# Patient Record
Sex: Female | Born: 1990 | Race: Black or African American | Hispanic: No | Marital: Single | Smoking: Current every day smoker
Health system: Southern US, Community
[De-identification: ages and names within clinical notes are randomized; demographics above are authoritative.]

## PROBLEM LIST (undated history)

## (undated) DIAGNOSIS — K259 Gastric ulcer, unspecified as acute or chronic, without hemorrhage or perforation: Secondary | ICD-10-CM

## (undated) DIAGNOSIS — A749 Chlamydial infection, unspecified: Secondary | ICD-10-CM

## (undated) DIAGNOSIS — F32A Depression, unspecified: Secondary | ICD-10-CM

## (undated) DIAGNOSIS — D649 Anemia, unspecified: Secondary | ICD-10-CM

## (undated) DIAGNOSIS — F329 Major depressive disorder, single episode, unspecified: Secondary | ICD-10-CM

---
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
## 2010-11-11 ENCOUNTER — Emergency Department: Payer: Self-pay | Admitting: Emergency Medicine

## 2011-05-03 ENCOUNTER — Emergency Department: Payer: Self-pay | Admitting: Emergency Medicine

## 2011-07-12 ENCOUNTER — Emergency Department: Payer: Self-pay | Admitting: Internal Medicine

## 2011-10-26 ENCOUNTER — Emergency Department: Payer: Self-pay | Admitting: Emergency Medicine

## 2012-02-04 ENCOUNTER — Emergency Department: Payer: Self-pay | Admitting: Internal Medicine

## 2012-05-04 ENCOUNTER — Emergency Department: Payer: Self-pay | Admitting: *Deleted

## 2012-05-04 LAB — URINALYSIS, COMPLETE
Bilirubin,UR: NEGATIVE
Blood: NEGATIVE
Glucose,UR: NEGATIVE mg/dL (ref 0–75)
Ketone: NEGATIVE
Nitrite: NEGATIVE
Ph: 5 (ref 4.5–8.0)
Protein: NEGATIVE
RBC,UR: 4 /HPF (ref 0–5)
Specific Gravity: 1.029 (ref 1.003–1.030)
Squamous Epithelial: 11
WBC UR: 5 /HPF (ref 0–5)

## 2012-05-04 LAB — WET PREP, GENITAL

## 2012-05-04 LAB — PREGNANCY, URINE: Pregnancy Test, Urine: NEGATIVE m[IU]/mL

## 2012-09-26 ENCOUNTER — Emergency Department: Payer: Self-pay | Admitting: Emergency Medicine

## 2012-09-26 ENCOUNTER — Emergency Department: Payer: Self-pay | Admitting: Unknown Physician Specialty

## 2012-10-03 ENCOUNTER — Emergency Department: Payer: Self-pay | Admitting: Emergency Medicine

## 2012-11-02 ENCOUNTER — Ambulatory Visit: Payer: Self-pay

## 2013-03-07 ENCOUNTER — Emergency Department: Payer: Self-pay | Admitting: Emergency Medicine

## 2013-06-03 ENCOUNTER — Emergency Department: Payer: Self-pay | Admitting: Internal Medicine

## 2013-06-03 LAB — CBC
HCT: 32.9 % — ABNORMAL LOW (ref 35.0–47.0)
HGB: 10.8 g/dL — ABNORMAL LOW (ref 12.0–16.0)
MCV: 86 fL (ref 80–100)
Platelet: 254 10*3/uL (ref 150–440)

## 2013-06-03 LAB — COMPREHENSIVE METABOLIC PANEL
Albumin: 4 g/dL (ref 3.4–5.0)
Calcium, Total: 8.9 mg/dL (ref 8.5–10.1)
Chloride: 109 mmol/L — ABNORMAL HIGH (ref 98–107)
Co2: 27 mmol/L (ref 21–32)
EGFR (African American): >60
EGFR (Non-African Amer.): >60
Potassium: 3.9 mmol/L (ref 3.5–5.1)
SGPT (ALT): 15 U/L (ref 12–78)

## 2013-06-03 LAB — URINALYSIS, COMPLETE
Blood: NEGATIVE
Glucose,UR: NEGATIVE mg/dL (ref 0–75)
Ketone: NEGATIVE
Nitrite: NEGATIVE
Ph: 6 (ref 4.5–8.0)
Protein: NEGATIVE
RBC,UR: 2 /HPF (ref 0–5)
Specific Gravity: 1.023 (ref 1.003–1.030)
Squamous Epithelial: 6
WBC UR: 2 /HPF (ref 0–5)

## 2013-06-03 LAB — WET PREP, GENITAL

## 2013-06-03 LAB — GC/CHLAMYDIA PROBE AMP

## 2013-10-11 ENCOUNTER — Emergency Department: Payer: Self-pay | Admitting: Internal Medicine

## 2013-10-11 LAB — COMPREHENSIVE METABOLIC PANEL
Albumin: 4.2 g/dL (ref 3.4–5.0)
Alkaline Phosphatase: 62 U/L (ref 50–136)
BUN: 9 mg/dL (ref 7–18)
Chloride: 106 mmol/L (ref 98–107)
Co2: 25 mmol/L (ref 21–32)
EGFR (African American): >60
Glucose: 94 mg/dL (ref 65–99)
Potassium: 3.1 mmol/L — ABNORMAL LOW (ref 3.5–5.1)
SGOT(AST): 22 U/L (ref 15–37)
SGPT (ALT): 14 U/L (ref 12–78)
Sodium: 137 mmol/L (ref 136–145)
Total Protein: 7.6 g/dL (ref 6.4–8.2)

## 2013-10-11 LAB — URINALYSIS, COMPLETE
Bacteria: NONE SEEN
Bilirubin,UR: NEGATIVE
Blood: NEGATIVE
Leukocyte Esterase: NEGATIVE
Nitrite: NEGATIVE
Ph: 6 (ref 4.5–8.0)
Protein: NEGATIVE
RBC,UR: 2 /HPF (ref 0–5)
Specific Gravity: 1.026 (ref 1.003–1.030)
WBC UR: 2 /HPF (ref 0–5)

## 2013-10-11 LAB — LIPASE, BLOOD: Lipase: 102 U/L (ref 73–393)

## 2013-10-11 LAB — CBC
HCT: 31.4 % — ABNORMAL LOW (ref 35.0–47.0)
MCH: 25.8 pg — ABNORMAL LOW (ref 26.0–34.0)
MCHC: 32.1 g/dL (ref 32.0–36.0)
RBC: 3.9 10*6/uL (ref 3.80–5.20)
WBC: 6.5 10*3/uL (ref 3.6–11.0)

## 2013-10-20 ENCOUNTER — Emergency Department: Payer: Self-pay | Admitting: Emergency Medicine

## 2013-10-20 LAB — URINALYSIS, COMPLETE
Bilirubin,UR: NEGATIVE
Blood: NEGATIVE
Nitrite: NEGATIVE
Ph: 8 (ref 4.5–8.0)
RBC,UR: <1 /HPF (ref 0–5)
Specific Gravity: 1.019 (ref 1.003–1.030)
Squamous Epithelial: 13
WBC UR: 10 /HPF (ref 0–5)

## 2014-05-19 ENCOUNTER — Emergency Department: Payer: Self-pay | Admitting: Emergency Medicine

## 2014-05-19 LAB — LIPASE, BLOOD: LIPASE: 148 U/L (ref 73–393)

## 2014-05-19 LAB — DRUG SCREEN, URINE

## 2014-05-19 LAB — COMPREHENSIVE METABOLIC PANEL
Albumin: 4 g/dL (ref 3.4–5.0)
Alkaline Phosphatase: 43 U/L — ABNORMAL LOW
Anion Gap: 8 (ref 7–16)
BUN: 5 mg/dL — ABNORMAL LOW (ref 7–18)
Bilirubin,Total: 0.8 mg/dL (ref 0.2–1.0)
Calcium, Total: 9.2 mg/dL (ref 8.5–10.1)
Chloride: 105 mmol/L (ref 98–107)
Co2: 23 mmol/L (ref 21–32)
Creatinine: 0.55 mg/dL — ABNORMAL LOW (ref 0.60–1.30)
EGFR (African American): >60
EGFR (Non-African Amer.): >60
Glucose: 78 mg/dL (ref 65–99)
Osmolality: 268 (ref 275–301)
Potassium: 3.5 mmol/L (ref 3.5–5.1)
SGOT(AST): 18 U/L (ref 15–37)
SGPT (ALT): 15 U/L (ref 12–78)
Sodium: 136 mmol/L (ref 136–145)
Total Protein: 7.7 g/dL (ref 6.4–8.2)

## 2014-05-19 LAB — URINALYSIS, COMPLETE
BLOOD: NEGATIVE
Bilirubin,UR: NEGATIVE
Glucose,UR: NEGATIVE mg/dL (ref 0–75)
Ketone: NEGATIVE
Leukocyte Esterase: NEGATIVE
NITRITE: NEGATIVE
Ph: 6 (ref 4.5–8.0)
Protein: NEGATIVE
RBC,UR: <1 /HPF (ref 0–5)
Specific Gravity: 1.025 (ref 1.003–1.030)
Squamous Epithelial: 22
WBC UR: 2 /HPF (ref 0–5)

## 2014-05-19 LAB — TSH: Thyroid Stimulating Horm: 1.01 u[IU]/mL

## 2014-05-19 LAB — CBC
HCT: 35.8 % (ref 35.0–47.0)
HGB: 11.6 g/dL — ABNORMAL LOW (ref 12.0–16.0)
MCH: 27.6 pg (ref 26.0–34.0)
MCHC: 32.3 g/dL (ref 32.0–36.0)
MCV: 85 fL (ref 80–100)
Platelet: 261 10*3/uL (ref 150–440)
RBC: 4.2 10*6/uL (ref 3.80–5.20)
RDW: 22.8 % — ABNORMAL HIGH (ref 11.5–14.5)
WBC: 7.8 10*3/uL (ref 3.6–11.0)

## 2014-05-19 LAB — WET PREP, GENITAL

## 2014-05-19 LAB — ETHANOL: Ethanol %: <0.003 % (ref 0.000–0.080)

## 2014-05-19 LAB — HCG, QUANTITATIVE, PREGNANCY: Beta Hcg, Quant.: 96274 m[IU]/mL — ABNORMAL HIGH

## 2014-05-19 LAB — SALICYLATE LEVEL: Salicylates, Serum: <1.7 mg/dL

## 2014-05-19 LAB — ACETAMINOPHEN LEVEL: Acetaminophen: <2 ug/mL

## 2014-05-19 LAB — GC/CHLAMYDIA PROBE AMP

## 2014-07-28 ENCOUNTER — Emergency Department: Payer: Self-pay | Admitting: Emergency Medicine

## 2014-07-28 LAB — COMPREHENSIVE METABOLIC PANEL
ALK PHOS: 43 U/L — AB
ALT: 16 U/L
Albumin: 3.4 g/dL (ref 3.4–5.0)
Anion Gap: 12 (ref 7–16)
BUN: 5 mg/dL — ABNORMAL LOW (ref 7–18)
Bilirubin,Total: 0.9 mg/dL (ref 0.2–1.0)
CALCIUM: 8.8 mg/dL (ref 8.5–10.1)
Chloride: 107 mmol/L (ref 98–107)
Co2: 20 mmol/L — ABNORMAL LOW (ref 21–32)
Creatinine: 0.63 mg/dL (ref 0.60–1.30)
EGFR (African American): >60
EGFR (Non-African Amer.): >60
Glucose: 89 mg/dL (ref 65–99)
Osmolality: 274 (ref 275–301)
Potassium: 3.6 mmol/L (ref 3.5–5.1)
SGOT(AST): 22 U/L (ref 15–37)
SODIUM: 139 mmol/L (ref 136–145)
Total Protein: 7.4 g/dL (ref 6.4–8.2)

## 2014-07-28 LAB — URINALYSIS, COMPLETE
BILIRUBIN, UR: NEGATIVE
Blood: NEGATIVE
GLUCOSE, UR: NEGATIVE mg/dL (ref 0–75)
Hyaline Cast: 5
Leukocyte Esterase: NEGATIVE
Nitrite: NEGATIVE
PH: 5 (ref 4.5–8.0)
RBC,UR: 2 /HPF (ref 0–5)
Specific Gravity: 1.025 (ref 1.003–1.030)
Squamous Epithelial: 23
WBC UR: 5 /HPF (ref 0–5)

## 2014-07-28 LAB — CBC
HCT: 36.5 % (ref 35.0–47.0)
HGB: 11.8 g/dL — ABNORMAL LOW (ref 12.0–16.0)
MCH: 31.8 pg (ref 26.0–34.0)
MCHC: 32.3 g/dL (ref 32.0–36.0)
MCV: 99 fL (ref 80–100)
Platelet: 270 10*3/uL (ref 150–440)
RBC: 3.7 10*6/uL — ABNORMAL LOW (ref 3.80–5.20)
RDW: 13 % (ref 11.5–14.5)
WBC: 11.7 10*3/uL — AB (ref 3.6–11.0)

## 2014-07-28 LAB — ETHANOL: Ethanol %: <0.003 % (ref 0.000–0.080)

## 2014-07-28 LAB — DRUG SCREEN, URINE

## 2014-07-28 LAB — ACETAMINOPHEN LEVEL: Acetaminophen: <2 ug/mL

## 2014-07-28 LAB — SALICYLATE LEVEL

## 2014-08-01 ENCOUNTER — Ambulatory Visit: Payer: Self-pay | Admitting: Family Medicine

## 2014-09-16 ENCOUNTER — Observation Stay: Payer: Self-pay

## 2014-11-02 ENCOUNTER — Ambulatory Visit: Payer: Self-pay | Admitting: Family Medicine

## 2014-12-20 ENCOUNTER — Ambulatory Visit: Payer: Self-pay | Admitting: Obstetrics and Gynecology

## 2014-12-20 LAB — CBC WITH DIFFERENTIAL/PLATELET
Basophil #: 0 10*3/uL (ref 0.0–0.1)
Basophil %: 0.2 %
EOS PCT: 0.2 %
Eosinophil #: 0 10*3/uL (ref 0.0–0.7)
HCT: 31.8 % — ABNORMAL LOW (ref 35.0–47.0)
HGB: 10.2 g/dL — AB (ref 12.0–16.0)
Lymphocyte #: 1.5 10*3/uL (ref 1.0–3.6)
Lymphocyte %: 18.2 %
MCH: 29.9 pg (ref 26.0–34.0)
MCHC: 32 g/dL (ref 32.0–36.0)
MCV: 94 fL (ref 80–100)
MONO ABS: 0.4 x10 3/mm (ref 0.2–0.9)
Monocyte %: 5.3 %
NEUTROS PCT: 76.1 %
Neutrophil #: 6.1 10*3/uL (ref 1.4–6.5)
Platelet: 213 10*3/uL (ref 150–440)
RBC: 3.4 10*6/uL — AB (ref 3.80–5.20)
RDW: 14 % (ref 11.5–14.5)
WBC: 8 10*3/uL (ref 3.6–11.0)

## 2014-12-21 ENCOUNTER — Inpatient Hospital Stay: Payer: Self-pay | Admitting: Obstetrics and Gynecology

## 2014-12-21 LAB — GC/CHLAMYDIA PROBE AMP

## 2014-12-22 LAB — HEMATOCRIT: HCT: 28.5 % — AB (ref 35.0–47.0)

## 2014-12-24 ENCOUNTER — Emergency Department: Payer: Self-pay | Admitting: Emergency Medicine

## 2015-02-05 ENCOUNTER — Emergency Department: Payer: Self-pay | Admitting: Student

## 2015-04-06 NOTE — Consult Note (Signed)
PATIENT NAMPenne Lash:  Johnson, Carrie L MR#:  [id number] DATE OF BIRTH:  1991-11-09  DATE OF CONSULTATION:  07/29/2014  CONSULTING PHYSICIAN:  Sanjana Folz K. Haeli Gerlich, MD  SEX: Female.  RACE: African American.  SUBJECTIVE: The patient was seen in consultation in [facility]. The patient is a 24 year old [place] female who was employed and held a job since last year. The patient stated in an impulse to her mother that she was going to drink Clorox and the message was carried from the mother to the ER physician. The patient stayed here overnight and calmed herself off. She talked to her mother on the phone who was reassuring and was helpful. The patient reports that she has a 24-year-old son and she is the only person who can drive him to school as he is in elementary school. Feels that she stated something stupid and that was not correct.  OBJECTIVE: Alert and oriented to place, person, time. Calm, pleasant, and cooperative. No agitation. Affect is neutral. Mood is stable. Denies feeling depressed. Realizes that she was upset because was having marital conflicts with her husband and she stated things that she did not mean. No psychosis. Cognition intact. Denies any ideas or plans to hurt herself or others. Insight and judgement fair and adequate. Contracts for safety.  IMPRESSION: Impulse control disorder.  PLAN: Discontinue involuntary commitment. Discharge patient home to go back to the family and keep her followup appointments with her primary care physician, OB/GYN, and get help from counselor so that she can have better understanding about herself and better relationship with her family.   ____________________________ Jannet MantisSurya K. Guss Bundehalla, MD skc:lt D: 07/29/2014 16:44:44 ET T: 07/29/2014 18:57:26 ET JOB#: [id number]  cc: Monika SalkSurya K. Guss Bundehalla, MD, <Dictator> Beau FannySURYA K Brantley Naser MD ELECTRONICALLY SIGNED 08/01/2014 23:00

## 2015-04-06 NOTE — Consult Note (Signed)
PATIENT NAMYolanda Johnson:  Carrie Johnson, Carrie Johnson MR#:  [id number] DATE OF BIRTH:  [birth year]  DATE OF CONSULTATION:  05/20/2014  REFERRING PHYSICIAN:   CONSULTING PHYSICIAN:  Laterria Lasota K. Guss Bundehalla, MD  SUBJECTIVE:  The patient is a 24 year old [place] female, not employed and married since February of 2014, lives with her husband who is 24 years old and works with paint.  The patient had marital conflict with her husband because she felt that he is ignoring her and she is not getting enough attention and so she tried to make superficial scratches on her left forearm with a razor that she shaves her legs.  The patient was smiling while talking, saying the same thing.  She was talking on the phone with her husband and appears to be smiling while talking to her husband.    CHIEF COMPLAINT:  "I just wanted my husband to give attention to me and to the baby as I am [redacted] weeks pregnant."  PAST PSYCHIATRIC HISTORY:  No previous history of depression or anxiety.  No history of suicide.  Not being followed by a psychiatrist.  [facility] her [facility] and last appointment was a few weeks ago, next appointment is coming up on 05/23/2014 and she is eager to keep the followup appointment.    ALCOHOL AND DRUGS:  Denied.  MENTAL STATUS EXAMINATION:  The patient is alert and oriented to place, person and time, fully aware of the situation that brought her here.  Affect is bright and cheerful.  She is having good conversation with the husband on the phone and she had to hang up to talk to the undersigned.  Smiling and cheerful and is eager to go home to take care of her husband and her baby and keep up her followup appointments.  No psychosis.  Cognition intact.  General information fair.  Denies suicidal or homicidal ideas or plans and contracts for safety.  Insight and judgment fair.    IMPRESSION:  Impulse control disorder with superficial abrasions on her left forearm which did not need any attention  or help or treatment.    RECOMMENDATIONS:  Discharge the patient so that she can go back to her husband and keep up her followup appointments in the community.  She will get help for her marital conflicts so that they can have better understanding of each other.    ____________________________ Jannet MantisSurya K. Guss Bundehalla, MD skc:cs D: 05/20/2014 17:32:41 ET T: 05/20/2014 20:20:54 ET JOB#: [id number]  cc: Monika SalkSurya K. Guss Bundehalla, MD, <Dictator> Beau FannySURYA K Fredrich Cory MD ELECTRONICALLY SIGNED 05/21/2014 7:02

## 2015-04-06 NOTE — Consult Note (Signed)
PATIENT NAMPenne Johnson:  Clapham, SHAKIRA L MR#:  [id number] DATE OF BIRTH:  [birth year]  DATE OF CONSULTATION:  07/28/2014  CONSULTING PHYSICIAN:  Kalli Greenfield K. Guss Bundehalla, MD  PLACE OF DICTATION:  [facility], [address], [place].  AGE: 24.  SEX:  Female.  RACE:  African American  SUBJECTIVE:  The patient was seen in consultation in the [facility].  The patient is a 24 year old African American female who is employed in the [employer] and held a job for 1 year.  The patient reports that she is married and having lots of marital conflicts according to information obtained from the staff at Emergency Room.  The physician called mother who stated that the patient threatened to drink Clorox.  When the patient was confronted, she did not agree to the same.  In addition, information obtained from the staff that she been text messaging to her husband about her wishes of suicide which the patient denies.  PAST PSYCHIATRIC HISTORY:  No previous history of inpatient hospital to psychiatry. No history of suicide attempt.  ALCOHOL AND DRUGS:  None.  MENTAL STATUS:  The patient is alert and oriented.  The patient was calm, but gets agitated when questions were asked, very manipulative and attention seeking and does not appear to respond to internal stimuli.  Cognition is intact.  Behavior is questionable and unpredictable, and she does need close observation, evaluation and help for the same.  Insight and judgment guarded.  Impulse control is poor.  IMPRESSION:  Impulse control disorder with depression.  RECOMMENDATIONS:  Recommend inpatient hospital psychiatry for close observation and help. No medications are being given as the patient is pregnant.    ____________________________ Jannet MantisSurya K. Guss Bundehalla, MD skc:DT D: 07/28/2014 19:34:13 ET T: 07/28/2014 20:02:38 ET JOB#: [id number]  cc: Monika SalkSurya K. Guss Bundehalla, MD, <Dictator> Beau FannySURYA K Princella Jaskiewicz MD ELECTRONICALLY SIGNED 07/29/2014 16:18

## 2015-04-08 LAB — SURGICAL PATHOLOGY

## 2015-04-14 NOTE — H&P (Signed)
PATIENT NAMEARRON, Carrie Johnson MR#:  [id number] DATE OF BIRTH:  27-Jul-1991  DATE OF ADMISSION:  12/21/2014  PREOPERATIVE DIAGNOSES:  1.  Prior cesarean section.  2.  Group B streptococcus negative.   POSTOPERATIVE DIAGNOSES:   1.  Prior cesarean section.  2.  Group B streptococcus negative.  3.  Delivery of viable female infant.   PROCEDURE:  Repeat low transverse cesarean section with placement of On-Q pump.  ANESTHESIA: Use spinal.   SURGEON: Christeen Douglas, MD   ESTIMATED BLOOD LOSS: 400 mL.     OPERATIVE FLUIDS: 1400 mL.   URINE OUTPUT: 150 mL.   COMPLICATIONS: None.   FINDINGS: A viable and vigorous female infant weighing 2790 grams or 6 pounds 2 ounces with Apgars of 8 and 9 at one and five minutes respectively. Normal uterus, ovaries, and bilateral tubes. A solid-appearing omental nodule that was removed and sent for specimen.   INDICATION FOR PROCEDURE: Ms. Carrie Johnson is a 24 year old gravida 2, para 1, 0-0-1 at 39 weeks and 5 days who presents for an elective repeat cesarean section.  Risks and benefits of this procedure were discussed with the patient and she consented to the procedure.  PROCEDURE:  The patient was taken to the operating room where she was placed on the operating table in the upright position and spinal anesthesia was induced without difficulty. She was then positioned in a supine position and prepped and draped in the usual sterile fashion. Ancef IV was given for prophylactic antibiotics prior to the start of the procedure.  A formal timeout was performed with all team members present and in agreement.   Pfannenstiel skin incision was made sharply and carried down to the fascia sharply. The fascia was then incised at the midline sharply and the fascial incision carried laterally using Mayo scissors.  Kocher clamps were used to grasp the superior edge of the rectus fascia and it was separated off the underlying rectus muscle using Mayo scissors. In a similar  fashion, the inferior edge of the rectus fascia was separated from the underlying rectus muscles.  Muscles were then divided bluntly in the midline and the peritoneum was entered sharply being careful to avoid any adherent tissue.  The peritoneal incision was stretched laterally bluntly and the bladder blade was placed.  A bladder flap was created and the bladder blade replaced.   Hysterotomy was made and carried carefully down to the uterine cavity. The membranes were ruptured in a controlled fashion for clear fluid.  The hysterotomy was spread laterally bluntly. The fetal vertex was then brought to the hysterotomy and using fundal pressure the baby was atraumatically delivered.  He was immediately vigorous and spontaneously crying.  He was bulb suctioned on the operative field. The cord was doubly clamped and cut and the baby was passed off to awaiting nursery staff.   A 3-vessel cord was noted and the placenta was manually expressed. The uterus was exteriorized and cleared of all clots and debris. The hysterotomy was closed in a running locked fashion using 0-Vicryl suture and a second imbricating layer was placed.  Several figure-of-eight sutures were used to ensure hemostasis. The peritoneal cavity was cleared of clots and debris and the uterus was placed back into the peritoneal cavity.  The peritoneal gutters were then cleared of blood and attention was turned returned to the hysterotomy.  Bovie cautery was used to assure hemostasis off of tension.   The On-Q catheters were then placed infraumbilically bilaterally.  The catheters  were curled under the fascial shelf and the fascia was closed in a running fashion using 0 Vicryl suture. The subcutaneous tissue was irrigated and then the skin was closed using absorbable staples.  A pressure dressing was applied and the On-Q pump catheters were secured to the maternal abdomen using Steri-Strips and Tegaderm, as well as Dermabond at the site of incision. The  patient tolerated all of these procedures well and is stable in recovery.      ____________________________ Cline CoolsBethany E. Marlon Suleiman, MD beb:DT D: 12/21/2014 09:21:10 ET T: 12/21/2014 09:45:45 ET JOB#: [id number]  cc: Cline CoolsBethany E. Ziyan Schoon, MD, <Dictator> Cline CoolsBETHANY E Kathe Wirick MD ELECTRONICALLY SIGNED 12/25/2014 9:28

## 2015-04-14 NOTE — Op Note (Signed)
PATIENT NAME:  Carrie Johnson, Carrie Johnson MR#:  [id number] DATE OF BIRTH:  January 16, 1991  DATE OF PROCEDURE:  12/21/2014  PREOPERATIVE DIAGNOSES:  1.  Prior cesarean section.  2.  Group B streptococcus negative.   POSTOPERATIVE DIAGNOSES:   1.  Prior cesarean section.  2.  Group B streptococcus negative.  3.  Delivery of viable female infant.   PROCEDURE:  Repeat low transverse cesarean section with placement of On-Q pump.  ANESTHESIA: Use spinal.   SURGEON: Christeen Douglas, MD   ESTIMATED BLOOD LOSS: 400 mL.     OPERATIVE FLUIDS: 1400 mL.   URINE OUTPUT: 150 mL.   COMPLICATIONS: None.   FINDINGS: A viable and vigorous female infant weighing 2790 grams or 6 pounds 2 ounces with Apgars of 8 and 9 at one and five minutes respectively. Normal uterus, ovaries, and bilateral tubes. A solid-appearing omental nodule that was removed and sent for specimen.   INDICATION FOR PROCEDURE: Ms. Carrie Johnson is a 24 year old gravida 2, para 1, 0-0-1 at 39 weeks and 5 days who presents for an elective repeat cesarean section.  Risks and benefits of this procedure were discussed with the patient and she consented to the procedure.  PROCEDURE:  The patient was taken to the operating room where she was placed on the operating table in the upright position and spinal anesthesia was induced without difficulty. She was then positioned in a supine position and prepped and draped in the usual sterile fashion. Ancef IV was given for prophylactic antibiotics prior to the start of the procedure.  A formal timeout was performed with all team members present and in agreement.   Pfannenstiel skin incision was made sharply and carried down to the fascia sharply. The fascia was then incised at the midline sharply and the fascial incision carried laterally using Mayo scissors.  Kocher clamps were used to grasp the superior edge of the rectus fascia and it was separated off the underlying rectus muscle using Mayo scissors. In a similar  fashion, the inferior edge of the rectus fascia was separated from the underlying rectus muscles.  Muscles were then divided bluntly in the midline and the peritoneum was entered sharply being careful to avoid any adherent tissue.  The peritoneal incision was stretched laterally bluntly and the bladder blade was placed.  A bladder flap was created and the bladder blade replaced.   Hysterotomy was made and carried carefully down to the uterine cavity. The membranes were ruptured in a controlled fashion for clear fluid.  The hysterotomy was spread laterally bluntly. The fetal vertex was then brought to the hysterotomy and using fundal pressure the baby was atraumatically delivered.  He was immediately vigorous and spontaneously crying.  He was bulb suctioned on the operative field. The cord was doubly clamped and cut and the baby was passed off to awaiting nursery staff.   A 3-vessel cord was noted and the placenta was manually expressed. The uterus was exteriorized and cleared of all clots and debris. The hysterotomy was closed in a running locked fashion using 0-Vicryl suture and a second imbricating layer was placed.  Several figure-of-eight sutures were used to ensure hemostasis. The peritoneal cavity was cleared of clots and debris and the uterus was placed back into the peritoneal cavity.  The peritoneal gutters were then cleared of blood and attention was turned returned to the hysterotomy.  Bovie cautery was used to assure hemostasis off of tension.   The On-Q catheters were then placed infraumbilically bilaterally.  The catheters  were curled under the fascial shelf and the fascia was closed in a running fashion using 0 Vicryl suture. The subcutaneous tissue was irrigated and then the skin was closed using absorbable staples.  A pressure dressing was applied and the On-Q pump catheters were secured to the maternal abdomen using Steri-Strips and Tegaderm, as well as Dermabond at the site of incision. The  patient tolerated all of these procedures well and is stable in recovery.      ____________________________ Cline CoolsBethany E. Fidela Cieslak, MD beb:DT D: 12/21/2014 09:21:10 ET T: 12/21/2014 09:45:45 ET JOB#: [id number]  cc: Cline CoolsBethany E. Ryn Peine, MD, <Dictator> Cline CoolsBETHANY E Jearline Hirschhorn MD ELECTRONICALLY SIGNED 12/25/2014 9:28

## 2015-09-05 ENCOUNTER — Emergency Department
Admission: EM | Admit: 2015-09-05 | Discharge: 2015-09-05 | Disposition: A | Discharge status: Home / Self Care | Payer: Medicaid Other | Attending: Emergency Medicine | Admitting: Emergency Medicine

## 2015-09-05 ENCOUNTER — Encounter: Payer: Self-pay | Admitting: *Deleted

## 2015-09-05 ENCOUNTER — Emergency Department: Payer: Medicaid Other

## 2015-09-05 DIAGNOSIS — O2341 Unspecified infection of urinary tract in pregnancy, first trimester: Secondary | ICD-10-CM | POA: Diagnosis not present

## 2015-09-05 DIAGNOSIS — N760 Acute vaginitis: Secondary | ICD-10-CM

## 2015-09-05 DIAGNOSIS — O9989 Other specified diseases and conditions complicating pregnancy, childbirth and the puerperium: Secondary | ICD-10-CM | POA: Diagnosis present

## 2015-09-05 DIAGNOSIS — B9689 Other specified bacterial agents as the cause of diseases classified elsewhere: Secondary | ICD-10-CM

## 2015-09-05 DIAGNOSIS — O99331 Smoking (tobacco) complicating pregnancy, first trimester: Secondary | ICD-10-CM | POA: Diagnosis not present

## 2015-09-05 DIAGNOSIS — O98811 Other maternal infectious and parasitic diseases complicating pregnancy, first trimester: Secondary | ICD-10-CM | POA: Insufficient documentation

## 2015-09-05 DIAGNOSIS — B373 Candidiasis of vulva and vagina: Secondary | ICD-10-CM | POA: Diagnosis not present

## 2015-09-05 DIAGNOSIS — O23591 Infection of other part of genital tract in pregnancy, first trimester: Secondary | ICD-10-CM | POA: Diagnosis not present

## 2015-09-05 DIAGNOSIS — Z3A01 Less than 8 weeks gestation of pregnancy: Secondary | ICD-10-CM | POA: Diagnosis not present

## 2015-09-05 DIAGNOSIS — F17210 Nicotine dependence, cigarettes, uncomplicated: Secondary | ICD-10-CM | POA: Insufficient documentation

## 2015-09-05 DIAGNOSIS — B3731 Acute candidiasis of vulva and vagina: Secondary | ICD-10-CM

## 2015-09-05 DIAGNOSIS — N390 Urinary tract infection, site not specified: Secondary | ICD-10-CM

## 2015-09-05 DIAGNOSIS — R102 Pelvic and perineal pain: Secondary | ICD-10-CM

## 2015-09-05 LAB — URINALYSIS COMPLETE WITH MICROSCOPIC (ARMC ONLY)
BILIRUBIN URINE: NEGATIVE
Glucose, UA: NEGATIVE mg/dL
HGB URINE DIPSTICK: NEGATIVE
KETONES UR: NEGATIVE mg/dL
NITRITE: NEGATIVE
PH: 6 (ref 5.0–8.0)
Protein, ur: NEGATIVE mg/dL
Specific Gravity, Urine: 1.02 (ref 1.005–1.030)

## 2015-09-05 LAB — COMPREHENSIVE METABOLIC PANEL
ALT: 13 U/L — AB (ref 14–54)
AST: 19 U/L (ref 15–41)
Albumin: 4.4 g/dL (ref 3.5–5.0)
Alkaline Phosphatase: 39 U/L (ref 38–126)
Anion gap: 7 (ref 5–15)
BUN: 10 mg/dL (ref 6–20)
CHLORIDE: 106 mmol/L (ref 101–111)
CO2: 27 mmol/L (ref 22–32)
CREATININE: 0.55 mg/dL (ref 0.44–1.00)
Calcium: 9.7 mg/dL (ref 8.9–10.3)
GFR calc non Af Amer: >60 mL/min (ref >60)
Glucose, Bld: 103 mg/dL — ABNORMAL HIGH (ref 65–99)
Potassium: 3.7 mmol/L (ref 3.5–5.1)
Sodium: 140 mmol/L (ref 135–145)
Total Bilirubin: 0.7 mg/dL (ref 0.3–1.2)
Total Protein: 7.3 g/dL (ref 6.5–8.1)

## 2015-09-05 LAB — WET PREP, GENITAL
Clue Cells Wet Prep HPF POC: POSITIVE — AB
Trich, Wet Prep: NONE SEEN
Yeast Wet Prep HPF POC: NONE SEEN

## 2015-09-05 LAB — CBC WITH DIFFERENTIAL/PLATELET
BASOS ABS: 0 10*3/uL (ref 0–0.1)
Basophils Relative: 1 %
EOS ABS: 0.1 10*3/uL (ref 0–0.7)
EOS PCT: 2 %
HCT: 28.1 % — ABNORMAL LOW (ref 35.0–47.0)
Hemoglobin: 8.7 g/dL — ABNORMAL LOW (ref 12.0–16.0)
Lymphocytes Relative: 39 %
Lymphs Abs: 2.3 10*3/uL (ref 1.0–3.6)
MCH: 23.1 pg — AB (ref 26.0–34.0)
MCHC: 31 g/dL — ABNORMAL LOW (ref 32.0–36.0)
MCV: 74.5 fL — ABNORMAL LOW (ref 80.0–100.0)
Monocytes Absolute: 0.4 10*3/uL (ref 0.2–0.9)
Monocytes Relative: 7 %
Neutro Abs: 3 10*3/uL (ref 1.4–6.5)
Neutrophils Relative %: 51 %
PLATELETS: 303 10*3/uL (ref 150–440)
RBC: 3.77 MIL/uL — AB (ref 3.80–5.20)
RDW: 20.6 % — ABNORMAL HIGH (ref 11.5–14.5)
WBC: 5.9 10*3/uL (ref 3.6–11.0)

## 2015-09-05 LAB — CHLAMYDIA/NGC RT PCR (ARMC ONLY)
Chlamydia Tr: NOT DETECTED
N GONORRHOEAE: NOT DETECTED

## 2015-09-05 LAB — HCG, QUANTITATIVE, PREGNANCY: hCG, Beta Chain, Quant, S: 3983 m[IU]/mL — ABNORMAL HIGH (ref <5)

## 2015-09-05 MED ORDER — MICONAZOLE NITRATE 2 % EX CREA: TOPICAL_CREAM | CUTANEOUS | Status: DC

## 2015-09-05 MED ORDER — CEPHALEXIN 500 MG PO CAPS: 500.0000 mg | ORAL_CAPSULE | Freq: Two times a day (BID) | ORAL | Status: DC

## 2015-09-05 MED ORDER — CEPHALEXIN 500 MG PO CAPS
500.0000 mg | ORAL_CAPSULE | Freq: Once | ORAL | Status: AC
  Administered 2015-09-05: 500 mg via ORAL
  Filled 2015-09-05 (×2): qty 1

## 2015-09-05 MED ORDER — METRONIDAZOLE 0.75 % VA GEL: 1.0000 | Freq: Two times a day (BID) | VAGINAL | Status: DC

## 2015-09-05 MED ORDER — ACETAMINOPHEN 500 MG PO TABS
500.0000 mg | ORAL_TABLET | Freq: Once | ORAL | Status: AC
  Administered 2015-09-05: 500 mg via ORAL
  Filled 2015-09-05: qty 1

## 2015-09-05 NOTE — ED Notes (Signed)
Patient c/o mid-abdominal pain that radiates to lower back. Patient states she saw her MD last week for abdominal pains and found out she was 3-weeks pregnant and had a yeast infection.

## 2015-09-05 NOTE — ED Provider Notes (Signed)
[facility] Emergency Department Provider Note   ____________________________________________  Time seen: 6:15 PM I have reviewed the triage vital signs and the triage nursing note.  HISTORY  Chief Complaint Abdominal Pain   Historian Patient  HPI Carrie Johnson is a 24 y.o. female who is pregnant presenting for 3-4 days of lower abdominal pain/pelvic pain. She had some discomfort like this about 1 week ago and was seen at her primary care physician office and told she was pregnant. Last menstrual period was "last month. " She states she was not told why she was having abdominal pain. She states she was sinus with a yeast infection, but did not fill medications.    History reviewed. No pertinent past medical history.  There are no active problems to display for this patient.   Past Surgical History  Procedure Laterality Date  . Cesarean section      x2    Current Outpatient Rx  Name  Route  Sig  Dispense  Refill  . cephALEXin (KEFLEX) 500 MG capsule   Oral   Take 1 capsule (500 mg total) by mouth 2 (two) times daily.   14 capsule   0   . miconazole (MICATIN) 2 % cream      Apply to affected area 2 times daily for 7 days   15 g   0     Allergies Review of patient's allergies indicates no known allergies.  History reviewed. No pertinent family history.  Social History Social History  Substance Use Topics  . Smoking status: Current Every Day Smoker  . Smokeless tobacco: None  . Alcohol Use: No    Review of Systems  Constitutional: Negative for fever. Eyes: Negative for visual changes. ENT: Negative for sore throat. Cardiovascular: Negative for chest pain. Respiratory: Negative for shortness of breath. Gastrointestinal: Negative for vomiting and diarrhea. Genitourinary: Negative for dysuria. Musculoskeletal: Negative for back pain. Skin: Negative for rash. Neurological: Negative for headache. 10 point Review of Systems  otherwise negative ____________________________________________   PHYSICAL EXAM:  VITAL SIGNS: ED Triage Vitals  Enc Vitals Group     BP 09/05/15 1730 117/62 mmHg     Pulse Rate 09/05/15 1730 74     Resp 09/05/15 1730 18     Temp 09/05/15 1730 98.8 F (37.1 C)     Temp Source 09/05/15 1730 Oral     SpO2 09/05/15 1730 100 %     Weight 09/05/15 1730 120 lb (54.432 kg)     Height 09/05/15 1730  (1.575 m)     Head Cir --      Peak Flow --      Pain Score 09/05/15 1731 8     Pain Loc --      Pain Edu? --      Excl. in GC? --      Constitutional: Alert and oriented. Well appearing and in no distress. Eyes: Conjunctivae are normal. PERRL. Normal extraocular movements. ENT   Head: Normocephalic and atraumatic.   Nose: No congestion/rhinnorhea.   Mouth/Throat: Mucous membranes are moist.   Neck: No stridor. Cardiovascular/Chest: Normal rate, regular rhythm.  No murmurs, rubs, or gallops. Respiratory: Normal respiratory effort without tachypnea nor retractions. Breath sounds are clear and equal bilaterally. No wheezes/rales/rhonchi. Gastrointestinal: Soft. No distention, no guarding, no rebound. Mild suprapubic tenderness to palpation.  Genitourinary/rectal: Moderate vaginal discharge. No cervicitis, mass, or adnexal tenderness on bimanual exam. No vaginal bleeding. Musculoskeletal: Nontender with normal range of motion in all  extremities. No joint effusions.  No lower extremity tenderness.  No edema. Neurologic:  Normal speech and language. No gross or focal neurologic deficits are appreciated. Skin:  Skin is warm, dry and intact. No rash noted. Psychiatric: Mood and affect are normal. Speech and behavior are normal. Patient exhibits appropriate insight and judgment.  ____________________________________________   EKG I, Governor Rooks, MD, the attending physician have personally viewed and interpreted all ECGs.  No EKG  performed ____________________________________________  LABS (pertinent positives/negatives)  White blood count 5.9, hemoglobin 8.7, platelet count 33 , Transmitted metabolic panel within normal limits Urinalysis rare bacteria, 6-30 white blood cells, leukocytes one plus, nitrites negative, squamous epithelial cells too numerous to count, yeast present   ____________________________________________  RADIOLOGY All Xrays were viewed by me. Imaging interpreted by Radiologist.  Ultrasound pelvic and transvaginal:  IMPRESSION: 1. Probable small intrauterine gestational sac. Size of the sac is consistent with an early pregnancy of 5 weeks and 2 days similar to the expected pregnancy stage based on the last menstrual period. Probable early intrauterine gestational sac, but no yolk sac, fetal pole, or cardiac activity yet visualized. Recommend follow-up quantitative B-HCG levels and follow-up US in 14 days to confirm and assess viability. This recommendation follows SRU consensus guidelines: Diagnostic Criteria for Nonviable Pregnancy Early in the First Trimester. Malva Limes Med 2013; 161:0960-45. 2. Right ovary not seen. Right adnexal free fluid and bowel gas is seen. At this stage, right sided ectopic pregnancy is not excluded. 3. No other abnormalities. __________________________________________  PROCEDURES  Procedure(s) performed: None  Critical Care performed: None  ____________________________________________   ED COURSE / ASSESSMENT AND PLAN  CONSULTATIONS: None  Pertinent labs & imaging results that were available during my care of the patient were reviewed by me and considered in my medical decision making (see chart for details).  Patient be treated for urinary tract infection in early pregnancy.  I discussed with her the findings of [redacted] week gestational sac. She will need a repeat ultrasound in 2 weeks per radiology. Patient states she followed up with the [facility] clinic  for her previous pregnancy, and I asked her to follow up again and had a repeat evaluation and ultrasound ordered from her provider.  Patient will be treated with metronidazole gel for potential vaginosis. Patient was treated for yeast vaginitis with vaginal applicator.  Patient / Family / Caregiver informed of clinical course, medical decision-making process, and agree with plan.   I discussed return precautions, follow-up instructions, and discharged instructions with patient and/or family.  ___________________________________________   FINAL CLINICAL IMPRESSION(S) / ED DIAGNOSES   Final diagnoses:  Pelvic pain in female  Urinary tract infection without hematuria, site unspecified  Yeast vaginitis       Governor Rooks, MD 09/05/15 2044

## 2015-09-05 NOTE — ED Notes (Signed)
Patient transported to Ultrasound 

## 2015-09-05 NOTE — ED Notes (Signed)
Mid abd pain for couple of days  Recently found out she was preg.  States pain radiates into lower back

## 2015-09-05 NOTE — Discharge Instructions (Signed)
You're being treated for yeast vaginitis with vaginal miconazole. You're being treated for urinary tract infection with Keflex. You're being treated for bacterial vaginosis with metronidazole gel. Your ultrasound showed gestational sac without a heartbeat at approximate 5 weeks. This may be due to just very early pregnancy and you need a repeat ultrasound in about 2 weeks. Follow up with your primary care doctor at [facility] clinic to make appointment for the sound.  Return to the emergency department for any new or worsening abdominal pain, urinary problems, fever, vomiting, vaginal bleeding, or any other symptoms concerning to.   Return to the emergency department for any new or worsening condition including pain, diarrhea, vomiting, fever, worsening pain, or any other symptoms concerning to you.  Take over-the-counter prenatal vitamin.  Do not take Advil or other pain medications, except for Tylenol while pregnant.

## 2015-09-07 LAB — URINE CULTURE

## 2015-09-19 ENCOUNTER — Other Ambulatory Visit: Payer: Self-pay | Admitting: Family Medicine

## 2015-09-19 DIAGNOSIS — O3680X0 Pregnancy with inconclusive fetal viability, not applicable or unspecified: Secondary | ICD-10-CM

## 2015-09-19 DIAGNOSIS — Z3490 Encounter for supervision of normal pregnancy, unspecified, unspecified trimester: Secondary | ICD-10-CM

## 2015-09-26 ENCOUNTER — Ambulatory Visit
Admission: RE | Admit: 2015-09-26 | Discharge: 2015-09-26 | Disposition: A | Discharge status: Home / Self Care | Payer: Medicaid Other | Source: Ambulatory Visit | Attending: Family Medicine | Admitting: Family Medicine

## 2015-09-26 DIAGNOSIS — Z3A01 Less than 8 weeks gestation of pregnancy: Secondary | ICD-10-CM | POA: Insufficient documentation

## 2015-09-26 DIAGNOSIS — O3680X0 Pregnancy with inconclusive fetal viability, not applicable or unspecified: Secondary | ICD-10-CM

## 2015-09-26 DIAGNOSIS — Z3480 Encounter for supervision of other normal pregnancy, unspecified trimester: Secondary | ICD-10-CM | POA: Diagnosis present

## 2015-09-26 DIAGNOSIS — Z36 Encounter for antenatal screening of mother: Secondary | ICD-10-CM | POA: Insufficient documentation

## 2015-09-26 DIAGNOSIS — Z3490 Encounter for supervision of normal pregnancy, unspecified, unspecified trimester: Secondary | ICD-10-CM

## 2016-04-02 ENCOUNTER — Encounter: Payer: Self-pay | Admitting: Emergency Medicine

## 2016-04-02 ENCOUNTER — Emergency Department
Admission: EM | Admit: 2016-04-02 | Discharge: 2016-04-02 | Disposition: A | Discharge status: Home / Self Care | Payer: Medicaid Other | Attending: Emergency Medicine | Admitting: Emergency Medicine

## 2016-04-02 DIAGNOSIS — J039 Acute tonsillitis, unspecified: Secondary | ICD-10-CM | POA: Diagnosis not present

## 2016-04-02 DIAGNOSIS — F17200 Nicotine dependence, unspecified, uncomplicated: Secondary | ICD-10-CM | POA: Diagnosis not present

## 2016-04-02 DIAGNOSIS — R509 Fever, unspecified: Secondary | ICD-10-CM | POA: Diagnosis present

## 2016-04-02 LAB — MONONUCLEOSIS SCREEN: Mono Screen: NEGATIVE

## 2016-04-02 MED ORDER — PREDNISONE 10 MG (21) PO TBPK: ORAL_TABLET | ORAL | Status: DC

## 2016-04-02 NOTE — ED Notes (Signed)
Pt presents to ED with sore throat and fever for three days. Pt states temperature up to 103. Pt denies any other symptoms.

## 2016-04-02 NOTE — ED Provider Notes (Signed)
CSN: [id number]     Arrival date & time 04/02/16  1034 History   First MD Initiated Contact with Patient 04/02/16 1047     Chief Complaint  Patient presents with  . Sore Throat  . Fever     HPI   25 year old female who presents to the emergency department for evaluation of sore throat. She states that she went to her PCP 3 days ago and was started on amoxicillin. She has had no improvement. 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  History reviewed. No pertinent past medical history. Past Surgical History  Procedure Laterality Date  . Cesarean section      x2   No family history on file. Social History  Substance Use Topics  . Smoking status: Current Every Day Smoker  . Smokeless tobacco: None  . Alcohol Use: No   OB History    Gravida Para Term Preterm AB TAB SAB Ectopic Multiple Living   4 2             Review of Systems  Constitutional: Positive for fever, chills, activity change and fatigue.  HENT: Positive for sore throat. Negative for ear pain.   Respiratory: Negative for cough.   Gastrointestinal: Negative for nausea, vomiting and diarrhea.  Musculoskeletal: Negative for neck pain and neck stiffness.  Skin: Negative for rash.      Allergies  Review of patient's allergies indicates no known allergies.  Home Medications   Prior to Admission medications   Medication Sig Start Date End Date Taking? Authorizing Provider  cephALEXin (KEFLEX) 500 MG capsule Take 1 capsule (500 mg total) by mouth 2 (two) times daily. 09/05/15   Governor Rooks, MD  metroNIDAZOLE (METROGEL VAGINAL) 0.75 % vaginal gel Place 1 Applicatorful vaginally 2 (two) times daily. For 7 days 09/05/15   Governor Rooks, MD  miconazole (MICATIN) 2 % cream Apply to affected area 2 times daily for 7 days 09/05/15 09/04/16  Governor Rooks, MD  predniSONE (STERAPRED UNI-PAK 21 TAB) 10 MG (21) TBPK tablet Take 6 tablets on day 1 Take 5 tablets on  day 2 Take 4 tablets on day 3 Take 3 tablets on day 4 Take 2 tablets on day 5 Take 1 tablet on day 6 04/02/16   Robyn Nohr B Shantanu Strauch, FNP   BP 126/78 mmHg  Pulse 98  Temp(Src) 99.5 F (37.5 C) (Oral)  Resp 16  Ht  (1.575 m)  Wt 54.885 kg  BMI 22.13 kg/m2  SpO2 99%  LMP 02/18/2016  Breastfeeding? Unknown Physical Exam  Constitutional: She is oriented to person, place, and time. She appears well-developed and well-nourished.  HENT:  Mouth/Throat: Uvula is midline and mucous membranes are normal. Oropharyngeal exudate present.    Eyes: Conjunctivae and EOM are normal.  Neck: Normal range of motion. Neck supple.  Pulmonary/Chest: Effort normal and breath sounds normal.  Abdominal: Soft.  Musculoskeletal: Normal range of motion.  Lymphadenopathy:    She has cervical adenopathy.  Neurological: She is alert and oriented to person, place, and time.  Skin: Skin is warm and dry. No rash noted.  Psychiatric: Her behavior is normal. Thought content normal.  Nursing note and vitals reviewed.   ED Course  Procedures (including critical care time) Labs Review Labs Reviewed  MONONUCLEOSIS SCREEN    Imaging Review No results found. I have personally reviewed and evaluated these images and lab results as part of my medical decision-making.  EKG Interpretation None      MDM   Final diagnoses:  Tonsillitis    Patient was advised to finish her amoxicillin and take the prednisone taper prescribed today. She was advised to take tylenol or ibuprofen for pain and/or fever. She was given a referral for ENT for symptoms that are not improving over the weekend. She was instructed to return to the ER for symptoms that change or worsen if she is unable to schedule an appointment with the PCP or specialist.     Chinita Pesterari B Samaa Ueda, FNP 04/04/16 1700  Sharyn CreamerMark Quale, MD 04/09/16 1606

## 2016-04-02 NOTE — Discharge Instructions (Signed)
Tonsillitis °Tonsillitis is an infection of the throat. This infection causes the tonsils to become red, tender, and puffy (swollen). Tonsils are groups of tissue at the back of your throat. If bacteria caused your infection, antibiotic medicine will be given to you. Sometimes symptoms of tonsillitis can be relieved with the use of steroid medicine. If your tonsillitis is severe and happens often, you may need to get your tonsils removed (tonsillectomy). °HOME CARE  °· Rest and sleep often. °· Drink enough fluids to keep your pee (urine) clear or pale yellow. °· While your throat is sore, eat soft or liquid foods like: °¨ Soup. °¨ Ice cream. °¨ Instant breakfast drinks. °· Eat frozen ice pops. °· Gargle with a warm or cold liquid to help soothe the throat. Gargle with a water and salt mix. Mix 1/4 teaspoon of salt and 1/4 teaspoon of baking soda in 1 cup of water. °· Only take medicines as told by your doctor. °· If you are given medicines (antibiotics), take them as told. Finish them even if you start to feel better. °GET HELP IF: °· You have large, tender lumps in your neck. °· You have a rash. °· You cough up green, yellow-brown, or bloody fluid. °· You cannot swallow liquids or food for 24 hours. °· You notice that only one of your tonsils is swollen. °GET HELP RIGHT AWAY IF:  °· You throw up (vomit). °· You have a very bad headache. °· You have a stiff neck. °· You have chest pain. °· You have trouble breathing or swallowing. °· You have bad throat pain, drooling, or your voice changes. °· You have bad pain not helped by medicine. °· You cannot fully open your mouth. °· You have redness, puffiness, or bad pain in the neck. °· You have a fever. °MAKE SURE YOU:  °· Understand these instructions. °· Will watch your condition. °· Will get help right away if you are not doing well or get worse. °  °This information is not intended to replace advice given to you by your health care provider. Make sure you discuss any  questions you have with your health care provider. °  °Document Released: 05/18/2008 Document Revised: 12/05/2013 Document Reviewed: 05/19/2013 °Elsevier Interactive Patient Education ©2016 Elsevier Inc. ° °

## 2016-04-30 ENCOUNTER — Telehealth: Payer: Self-pay | Admitting: Emergency Medicine

## 2016-04-30 ENCOUNTER — Emergency Department
Admission: EM | Admit: 2016-04-30 | Discharge: 2016-04-30 | Disposition: A | Discharge status: Home / Self Care | Payer: Medicaid Other | Attending: Emergency Medicine | Admitting: Emergency Medicine

## 2016-04-30 ENCOUNTER — Encounter: Payer: Self-pay | Admitting: Emergency Medicine

## 2016-04-30 DIAGNOSIS — N390 Urinary tract infection, site not specified: Secondary | ICD-10-CM

## 2016-04-30 DIAGNOSIS — F17200 Nicotine dependence, unspecified, uncomplicated: Secondary | ICD-10-CM | POA: Diagnosis not present

## 2016-04-30 DIAGNOSIS — Z711 Person with feared health complaint in whom no diagnosis is made: Secondary | ICD-10-CM

## 2016-04-30 LAB — URINALYSIS COMPLETE WITH MICROSCOPIC (ARMC ONLY)
Bacteria, UA: NONE SEEN
Bilirubin Urine: NEGATIVE
Glucose, UA: NEGATIVE mg/dL
KETONES UR: NEGATIVE mg/dL
NITRITE: NEGATIVE
PROTEIN: 100 mg/dL — AB
SPECIFIC GRAVITY, URINE: 1.027 (ref 1.005–1.030)
pH: 5 (ref 5.0–8.0)

## 2016-04-30 LAB — CHLAMYDIA/NGC RT PCR (ARMC ONLY)
Chlamydia Tr: DETECTED — AB
N gonorrhoeae: NOT DETECTED

## 2016-04-30 LAB — PREGNANCY, URINE: Preg Test, Ur: NEGATIVE

## 2016-04-30 LAB — WET PREP, GENITAL
Clue Cells Wet Prep HPF POC: NONE SEEN
Trich, Wet Prep: NONE SEEN
YEAST WET PREP: NONE SEEN

## 2016-04-30 MED ORDER — SULFAMETHOXAZOLE-TRIMETHOPRIM 800-160 MG PO TABS: 1.0000 | ORAL_TABLET | Freq: Two times a day (BID) | ORAL | Status: DC

## 2016-04-30 NOTE — ED Notes (Signed)
See triage note  States she has had some clear vaginal discharge for the past 3 days.  States last period was 04/18/2016 but also has noticed some additional vaginal spotting

## 2016-04-30 NOTE — Discharge Instructions (Signed)
Follow-up with your Dr[facility]. You may also call the health department for an appointment. Begin taking Septra DS for urinary tract infection. Take the entire 10 day course. Increase fluids. You will be notified if your Chlamydia or gonorrhea test is positive.

## 2016-04-30 NOTE — ED Notes (Signed)
Pt reports having sex with someone last night with possible STD; reports clear/white vaginal d/c x3 days. Pt reports hx of bacterial infections.

## 2016-04-30 NOTE — ED Provider Notes (Signed)
[facility] Emergency Department Provider Note   ____________________________________________  Time seen: Approximately 8:43 AM  I have reviewed the triage vital signs and the nursing notes.   HISTORY  Chief Complaint Exposure to STD    HPI Carrie Johnson is a 25 y.o. female is here to be tested for possible STDs. Patient states she had unprotected sex with her husband last night. She states that he complained of burning beginning last evening. She reports that she had a clear vaginal discharge for the last 3 days. She states that she has had a history of bacterial vaginitis in the past. She denies any other symptoms.   History reviewed. No pertinent past medical history.  There are no active problems to display for this patient.   Past Surgical History  Procedure Laterality Date  . Cesarean section      x2    Current Outpatient Rx  Name  Route  Sig  Dispense  Refill  . sulfamethoxazole-trimethoprim (BACTRIM DS,SEPTRA DS) 800-160 MG tablet   Oral   Take 1 tablet by mouth 2 (two) times daily.   20 tablet   0     Allergies Review of patient's allergies indicates no known allergies.  No family history on file.  Social History Social History  Substance Use Topics  . Smoking status: Current Every Day Smoker  . Smokeless tobacco: None  . Alcohol Use: No    Review of Systems Constitutional: No fever/chills Cardiovascular: Denies chest pain. Respiratory: Denies shortness of breath. Gastrointestinal: No abdominal pain.  No nausea, no vomiting.  No diarrhea.   Genitourinary: Negative for dysuria. Positive history of bacterial vaginitis. Musculoskeletal: Negative for back pain. Skin: Negative for rash. Neurological: Negative for headaches, focal weakness or numbness.  10-point ROS otherwise negative.  ____________________________________________   PHYSICAL EXAM:  VITAL SIGNS: ED Triage Vitals  Enc Vitals Group     BP 04/30/16  0835 134/83 mmHg     Pulse Rate 04/30/16 0835 69     Resp 04/30/16 0835 16     Temp 04/30/16 0835 98.1 F (36.7 C)     Temp Source 04/30/16 0835 Oral     SpO2 04/30/16 0835 100 %     Weight 04/30/16 0835 118 lb (53.524 kg)     Height 04/30/16 0835 5\' 2"  (1.575 m)     Head Cir --      Peak Flow --      Pain Score 04/30/16 0835 0     Pain Loc --      Pain Edu? --      Excl. in GC? --     Constitutional: Alert and oriented. Well appearing and in no acute distress. Eyes: Conjunctivae are normal. PERRL. EOMI. Head: Atraumatic. Nose: No congestion/rhinnorhea. Neck: No stridor.   Cardiovascular: Normal rate, regular rhythm. Grossly normal heart sounds.  Good peripheral circulation. Respiratory: Normal respiratory effort.  No retractions. Lungs CTAB. Gastrointestinal: Soft and nontender. No distention. Genitourinary: Pelvic exam external genitalia no rashes or discharge noted. There is some minimal menstrual blood in the vaginal vault. Culture was obtained without any difficulty. There is no cervical motion tenderness on exam and only minimal bilateral adnexal tenderness. No masses were noted. Musculoskeletal: No lower extremity tenderness nor edema.  Normal gait was noted Neurologic:  Normal speech and language. No gross focal neurologic deficits are appreciated. No gait instability. Skin:  Skin is warm, dry and intact. No rash noted. Psychiatric: Mood and affect are normal. Speech and  behavior are normal.  ____________________________________________   LABS (all labs ordered are listed, but only abnormal results are displayed)  Labs Reviewed  CHLAMYDIA/NGC RT PCR ([facility] ONLY) - Abnormal; Notable for the following:    Chlamydia Tr DETECTED (*)    All other components within normal limits  WET PREP, GENITAL - Abnormal; Notable for the following:    WBC, Wet Prep HPF POC FEW (*)    All other components within normal limits  URINALYSIS COMPLETEWITH MICROSCOPIC ([facility] ONLY) -  Abnormal; Notable for the following:    Color, Urine YELLOW (*)    APPearance CLOUDY (*)    Hgb urine dipstick 3+ (*)    Protein, ur 100 (*)    Leukocytes, UA 1+ (*)    Squamous Epithelial / LPF 6-30 (*)    All other components within normal limits  PREGNANCY, URINE     PROCEDURES  Procedure(s) performed: None  Critical Care performed: No  ____________________________________________   INITIAL IMPRESSION / ASSESSMENT AND PLAN / ED COURSE  Pertinent labs & imaging results that were available during my care of the patient were reviewed by me and considered in my medical decision making (see chart for details).  Patient was treated on the results of her urine at this time. Both she and husband have STI testing done and will be called if these are positive. At this time patient was given a prescription for Bactrim DS twice a day for 10 days. She is encouraged to follow-up with her primary care doctor or the health department. ____________________________________________   FINAL CLINICAL IMPRESSION(S) / ED DIAGNOSES  Final diagnoses:  Acute urinary tract infection  Concern about STD in female without diagnosis      NEW MEDICATIONS STARTED DURING THIS VISIT:  Discharge Medication List as of 04/30/2016 10:09 AM    START taking these medications   Details  sulfamethoxazole-trimethoprim (BACTRIM DS,SEPTRA DS) 800-160 MG tablet Take 1 tablet by mouth 2 (two) times daily., Starting 04/30/2016, Until Discontinued, Print         Note:  This document was prepared using Dragon voice recognition software and may include unintentional dictation errors.    Tommi Rumps, PA-C 04/30/16 1448  Minna Antis, MD 05/01/16 2234

## 2016-07-01 ENCOUNTER — Emergency Department
Admission: EM | Admit: 2016-07-01 | Discharge: 2016-07-01 | Disposition: A | Discharge status: Home / Self Care | Payer: Medicaid Other | Attending: Emergency Medicine | Admitting: Emergency Medicine

## 2016-07-01 ENCOUNTER — Encounter: Payer: Self-pay | Admitting: Emergency Medicine

## 2016-07-01 DIAGNOSIS — Y939 Activity, unspecified: Secondary | ICD-10-CM | POA: Diagnosis not present

## 2016-07-01 DIAGNOSIS — W228XXA Striking against or struck by other objects, initial encounter: Secondary | ICD-10-CM | POA: Diagnosis not present

## 2016-07-01 DIAGNOSIS — Y999 Unspecified external cause status: Secondary | ICD-10-CM | POA: Diagnosis not present

## 2016-07-01 DIAGNOSIS — S01511A Laceration without foreign body of lip, initial encounter: Secondary | ICD-10-CM | POA: Diagnosis present

## 2016-07-01 DIAGNOSIS — F17200 Nicotine dependence, unspecified, uncomplicated: Secondary | ICD-10-CM | POA: Diagnosis not present

## 2016-07-01 DIAGNOSIS — Z23 Encounter for immunization: Secondary | ICD-10-CM

## 2016-07-01 DIAGNOSIS — Y929 Unspecified place or not applicable: Secondary | ICD-10-CM | POA: Diagnosis not present

## 2016-07-01 MED ORDER — TETANUS-DIPHTH-ACELL PERTUSSIS 5-2.5-18.5 LF-MCG/0.5 IM SUSP
0.5000 mL | Freq: Once | INTRAMUSCULAR | Status: AC
  Administered 2016-07-01: 0.5 mL via INTRAMUSCULAR
  Filled 2016-07-01: qty 0.5

## 2016-07-01 MED ORDER — CEPHALEXIN 500 MG PO CAPS: 500.0000 mg | ORAL_CAPSULE | Freq: Three times a day (TID) | ORAL | Status: DC

## 2016-07-01 MED ORDER — HYDROCODONE-ACETAMINOPHEN 5-325 MG PO TABS: 1.0000 | ORAL_TABLET | Freq: Four times a day (QID) | ORAL | Status: DC | PRN

## 2016-07-01 MED ORDER — ACETAMINOPHEN 500 MG PO TABS
1000.0000 mg | ORAL_TABLET | Freq: Once | ORAL | Status: AC
  Administered 2016-07-01: 1000 mg via ORAL
  Filled 2016-07-01: qty 2

## 2016-07-01 MED ORDER — LIDOCAINE HCL (PF) 1 % IJ SOLN
5.0000 mL | Freq: Once | INTRAMUSCULAR | Status: AC
  Administered 2016-07-01: 5 mL
  Filled 2016-07-01: qty 5

## 2016-07-01 NOTE — ED Notes (Signed)
Patient ambulatory to triage with steady gait, pt reports had car door swing and hit her in the mouth. Laceration and swelling noted to to upper lip. Bleeding controlled.

## 2016-07-01 NOTE — Discharge Instructions (Signed)
Mouth Laceration A mouth laceration is a deep cut in the lining of your mouth (mucosa). The laceration may extend into your lip or go all of the way through your mouth and cheek. Lacerations inside your mouth may involve your tongue, the insides of your cheeks, or the upper surface of your mouth (palate). Mouth lacerations may bleed a lot because your mouth has a very rich blood supply. Mouth lacerations may need to be repaired with stitches (sutures). CAUSES Any type of facial injury can cause a mouth laceration. Common causes include:  Getting hit in the mouth.  Being in a car accident. SYMPTOMS The most common sign of a mouth laceration is bleeding that fills the mouth. DIAGNOSIS Your health care provider can diagnose a mouth laceration by examining your mouth. Your mouth may need to be washed out (irrigated) with a sterile salt-water (saline) solution. Your health care provider may also have to remove any blood clots to determine how bad your injury is. You may need X-rays of the bones in your jaw or your face to rule out other injuries, such as dental injuries, facial fractures, or jaw fractures. TREATMENT Treatment depends on the location and severity of your injury. Small mouth lacerations may not need treatment if bleeding has stopped. You may need sutures if:  You have a tongue laceration.  Your mouth laceration is large or deep, or it continues to bleed. If sutures are necessary, your health care provider will use absorbable sutures that dissolve as your body heals. You may also receive antibiotic medicine or a tetanus shot. HOME CARE INSTRUCTIONS  Take medicines only as directed by your health care provider.  If you were prescribed an antibiotic medicine, finish all of it even if you start to feel better.  Eat as directed by your health care provider. You may only be able to drink liquids or eat soft foods for a few days.  Rinse your mouth with a warm, salt-water rinse 4-6  times per day or as directed by your health care provider. You can make a salt-water rinse by mixing one tsp of salt into two cups of warm water.  Do not poke the sutures with your tongue. Doing that can loosen them.  Check your wound every day for signs of infection. It is normal to have a white or gray patch over your wound while it heals. Watch for:  Redness.  Swelling.  Blood or pus.  Maintain regular oral hygiene, if possible. Gently brush your teeth with a soft, nylon-bristled toothbrush 2 times per day.  Keep all follow-up visits as directed by your health care provider. This is important. SEEK MEDICAL CARE IF:  You were given a tetanus shot and have swelling, severe pain, redness, or bleeding at the injection site.  You have a fever.  Your pain is not controlled with medicine.  You have redness, swelling, or pain at your wound that is getting worse.  You have fresh bleeding or pus coming from your wound.  The edges of your wound break open.  You develop swollen, tender glands in your throat. SEEK IMMEDIATE MEDICAL CARE IF:   Your face or the area under your jaw becomes swollen.  You have trouble breathing or swallowing.   This information is not intended to replace advice given to you by your health care provider. Make sure you discuss any questions you have with your health care provider.   Document Released: 11/30/2005 Document Revised: 04/16/2015 Document Reviewed: 11/21/2014 Elsevier Interactive Patient  Education 2016 [employer]  Tdap Vaccine (Tetanus, Diphtheria and Pertussis): What You Need to Know 1. Why get vaccinated? Tetanus, diphtheria and pertussis are very serious diseases. Tdap vaccine can protect us from these diseases. And, Tdap vaccine given to pregnant women can protect newborn babies against pertussis. TETANUS (Lockjaw) is rare in the [place] today. It causes painful muscle tightening and stiffness, usually all over the body.  It can  lead to tightening of muscles in the head and neck so you can't open your mouth, swallow, or sometimes even breathe. Tetanus kills about 1 out of 10 people who are infected even after receiving the best medical care. DIPHTHERIA is also rare in the [place] today. It can cause a thick coating to form in the back of the throat.  It can lead to breathing problems, heart failure, paralysis, and death. PERTUSSIS (Whooping Cough) causes severe coughing spells, which can cause difficulty breathing, vomiting and disturbed sleep.  It can also lead to weight loss, incontinence, and rib fractures. Up to 2 in 100 adolescents and 5 in 100 adults with pertussis are hospitalized or have complications, which could include pneumonia or death. These diseases are caused by bacteria. Diphtheria and pertussis are spread from person to person through secretions from coughing or sneezing. Tetanus enters the body through cuts, scratches, or wounds. Before vaccines, as many as 200,000 cases of diphtheria, 200,000 cases of pertussis, and hundreds of cases of tetanus, were reported in the [place] each year. Since vaccination began, reports of cases for tetanus and diphtheria have dropped by about 99% and for pertussis by about 80%. 2. Tdap vaccine Tdap vaccine can protect adolescents and adults from tetanus, diphtheria, and pertussis. One dose of Tdap is routinely given at age 811 or 6612. People who did not get Tdap at that age should get it as soon as possible. Tdap is especially important for healthcare professionals and anyone having close contact with a baby younger than 12 months. Pregnant women should get a dose of Tdap during every pregnancy, to protect the newborn from pertussis. Infants are most at risk for severe, life-threatening complications from pertussis. Another vaccine, called Td, protects against tetanus and diphtheria, but not pertussis. A Td booster should be given every 10 years. Tdap may be given as  one of these boosters if you have never gotten Tdap before. Tdap may also be given after a severe cut or burn to prevent tetanus infection. Your doctor or the person giving you the vaccine can give you more information. Tdap may safely be given at the same time as other vaccines. 3. Some people should not get this vaccine  A person who has ever had a life-threatening allergic reaction after a previous dose of any diphtheria, tetanus or pertussis containing vaccine, OR has a severe allergy to any part of this vaccine, should not get Tdap vaccine. Tell the person giving the vaccine about any severe allergies.  Anyone who had coma or long repeated seizures within 7 days after a childhood dose of DTP or DTaP, or a previous dose of Tdap, should not get Tdap, unless a cause other than the vaccine was found. They can still get Td.  Talk to your doctor if you:  have seizures or another nervous system problem,  had severe pain or swelling after any vaccine containing diphtheria, tetanus or pertussis,  ever had a condition called Guillain-Barr Syndrome (GBS),  aren't feeling well on the day the shot is scheduled. 4. Risks With  any medicine, including vaccines, there is a chance of side effects. These are usually mild and go away on their own. Serious reactions are also possible but are rare. Most people who get Tdap vaccine do not have any problems with it. Mild problems following Tdap (Did not interfere with activities)  Pain where the shot was given (about 3 in 4 adolescents or 2 in 3 adults)  Redness or swelling where the shot was given (about 1 person in 5)  Mild fever of at least 100.65F (up to about 1 in 25 adolescents or 1 in 100 adults)  Headache (about 3 or 4 people in 10)  Tiredness (about 1 person in 3 or 4)  Nausea, vomiting, diarrhea, stomach ache (up to 1 in 4 adolescents or 1 in 10 adults)  Chills, sore joints (about 1 person in 10)  Body aches (about 1 person in 3 or  4)  Rash, swollen glands (uncommon) Moderate problems following Tdap (Interfered with activities, but did not require medical attention)  Pain where the shot was given (up to 1 in 5 or 6)  Redness or swelling where the shot was given (up to about 1 in 16 adolescents or 1 in 12 adults)  Fever over 102F (about 1 in 100 adolescents or 1 in 250 adults)  Headache (about 1 in 7 adolescents or 1 in 10 adults)  Nausea, vomiting, diarrhea, stomach ache (up to 1 or 3 people in 100)  Swelling of the entire arm where the shot was given (up to about 1 in 500). Severe problems following Tdap (Unable to perform usual activities; required medical attention)  Swelling, severe pain, bleeding and redness in the arm where the shot was given (rare). Problems that could happen after any vaccine:  People sometimes faint after a medical procedure, including vaccination. Sitting or lying down for about 15 minutes can help prevent fainting, and injuries caused by a fall. Tell your doctor if you feel dizzy, or have vision changes or ringing in the ears.  Some people get severe pain in the shoulder and have difficulty moving the arm where a shot was given. This happens very rarely.  Any medication can cause a severe allergic reaction. Such reactions from a vaccine are very rare, estimated at fewer than 1 in a million doses, and would happen within a few minutes to a few hours after the vaccination. As with any medicine, there is a very remote chance of a vaccine causing a serious injury or death. The safety of vaccines is always being monitored. For more information, visit: http://floyd.org/ 5. What if there is a serious problem? What should I look for?  Look for anything that concerns you, such as signs of a severe allergic reaction, very high fever, or unusual behavior.  Signs of a severe allergic reaction can include hives, swelling of the face and throat, difficulty breathing, a fast heartbeat,  dizziness, and weakness. These would usually start a few minutes to a few hours after the vaccination. What should I do?  If you think it is a severe allergic reaction or other emergency that can't wait, call 9-1-1 or get the person to the nearest hospital. Otherwise, call your doctor.  Afterward, the reaction should be reported to the Vaccine Adverse Event Reporting System (VAERS). Your doctor might file this report, or you can do it yourself through the VAERS web site at www.vaers.LAgents.no, or by calling 1-612-829-9650. VAERS does not give medical advice.  6. The National Vaccine Injury [employer]  The [employer] Injury Compensation Program (VICP) is a federal program that was created to compensate people who may have been injured by certain vaccines. Persons who believe they may have been injured by a vaccine can learn about the program and about filing a claim by calling 1-757-524-9059 or visiting the VICP website at SpiritualWord.at. There is a time limit to file a claim for compensation. 7. How can I learn more?  Ask your doctor. He or she can give you the vaccine package insert or suggest other sources of information.  Call your local or state health department.  Contact the Centers for Disease Control and Prevention (CDC):  Call 941-352-2936 (1-800-CDC-INFO) or  Visit CDC's website at PicCapture.uy CDC Tdap Vaccine VIS (02/06/14)   This information is not intended to replace advice given to you by your health care provider. Make sure you discuss any questions you have with your health care provider.   Document Released: 05/31/2012 Document Revised: 12/21/2014 Document Reviewed: 03/14/2014 Elsevier Interactive Patient Education [employer]

## 2016-07-01 NOTE — ED Notes (Signed)
Pt to ED with c/o lip laceration and will not be assessed states she has been seen too many times and she has doesn't want to explain anymore.

## 2016-07-01 NOTE — ED Notes (Signed)
Pt refuses d/c vs

## 2016-07-01 NOTE — ED Provider Notes (Signed)
[facility] Emergency Department Provider Note  ____________________________________________  Time seen: Approximately 7:38 PM  I have reviewed the triage vital signs and the nursing notes.   HISTORY  Chief Complaint Lip Laceration    HPI Carrie Johnson is a 25 y.o. female, NAD, presents to the emergency department with a 30 minute history of lip laceration.  She states that approximately 30 minutes ago her car door swung closed and hit her in the face. She was able to control the bleeding with pressure but could not tolerate an ice pack. She has had a headache since the door hit her. Denies dizziness, LOC, numbness, weakness, tingling, visual changes. She is accompanied by her significant other who states that patient has had no changes in speech or gait. She is unable to recall her last tetanus booster.    History reviewed. No pertinent past medical history.  There are no active problems to display for this patient.   Past Surgical History  Procedure Laterality Date  . Cesarean section      x2    Current Outpatient Rx  Name  Route  Sig  Dispense  Refill  . cephALEXin (KEFLEX) 500 MG capsule   Oral   Take 1 capsule (500 mg total) by mouth 3 (three) times daily.   21 capsule   0   . HYDROcodone-acetaminophen (NORCO) 5-325 MG tablet   Oral   Take 1 tablet by mouth every 6 (six) hours as needed for severe pain.   6 tablet   0   . sulfamethoxazole-trimethoprim (BACTRIM DS,SEPTRA DS) 800-160 MG tablet   Oral   Take 1 tablet by mouth 2 (two) times daily.   20 tablet   0     Allergies Review of patient's allergies indicates no known allergies.  No family history on file.  Social History Social History  Substance Use Topics  . Smoking status: Current Every Day Smoker  . Smokeless tobacco: None  . Alcohol Use: No     Review of Systems  Constitutional: No fatigue ENT: No Loose teeth or dental pain. Cardiovascular: No chest  pain. Respiratory: No shortness of breath. No wheezing.  Gastrointestinal: No abdominal pain.  No nausea, vomiting.  Musculoskeletal: Negative for neck pain.  Skin: Laceration left upper lip. Negative for rash.  Neurological: Positive for headache, but no focal weakness or numbness. No tingling. No head injury, LOC, dizziness. 10-point ROS otherwise negative.  ____________________________________________   PHYSICAL EXAM:  VITAL SIGNS: ED Triage Vitals  Enc Vitals Group     BP 07/01/16 1909 140/82 mmHg     Pulse Rate 07/01/16 1909 84     Resp 07/01/16 1909 18     Temp 07/01/16 1909 98.6 F (37 C)     Temp Source 07/01/16 1909 Oral     SpO2 07/01/16 1909 100 %     Weight 07/01/16 1909 115 lb (52.164 kg)     Height 07/01/16 1909  (1.549 m)     Head Cir --      Peak Flow --      Pain Score 07/01/16 1910 10     Pain Loc --      Pain Edu? --      Excl. in GC? --      Constitutional: Alert and oriented. Well appearing and in no acute distress. Eyes: Conjunctivae are normal. PERRL. EOMI without pain.  Head: Normocephalic. ENT:      Nose: No congestion/rhinnorhea.  Mouth/Throat: Mucous membranes are moist. No loose teeth or damage to teeth. No bleeding about the buccal mucosa. Neck: Supple with full range of motion Hematological/Lymphatic/Immunilogical: No cervical lymphadenopathy. Cardiovascular: Good peripheral circulation. Respiratory: Normal respiratory effort without tachypnea or retractions.  Neurologic:  Normal speech and language. No gross focal neurologic deficits are appreciated. Normal gait and posture Skin:  Skin is warm and dry. No rash noted. Patient has a 2.25 cm laceration to her left upper lip that does not cross the vermilion boarder. Bleeding is controlled.  Psychiatric: Mood and affect are normal. Speech and behavior are normal. Patient exhibits appropriate insight and judgement.   ____________________________________________    LABS  None ____________________________________________  EKG  None ____________________________________________  RADIOLOGY  None ____________________________________________    PROCEDURES  Procedure(s) performed: LACERATION REPAIR Performed by: Hope PigeonJami L Valree Feild Authorized by: Hope PigeonJami L [place] Consent: Verbal consent obtained. Risks and benefits: risks, benefits and alternatives were discussed Consent given by: patient Patient identity confirmed: provided demographic data Prepped and Draped in normal sterile fashion Wound explored  Laceration Location: Left upper lip  Laceration Length: 2.25cm  No Foreign Bodies seen or palpated  Anesthesia: local infiltration  Local anesthetic: lidocaine 1% w/o epinephrine  Anesthetic total: 5ml  Irrigation method: syringe Amount of cleaning: standard  Skin closure: 5-0 absorbable  Number of sutures: 5  Technique: Simple interrupted  Patient tolerance: Patient tolerated the procedure well with no immediate complications.      Medications  acetaminophen (TYLENOL) tablet 1,000 mg (1,000 mg Oral Given 07/01/16 1939)  lidocaine (PF) (XYLOCAINE) 1 % injection 5 mL (5 mLs Infiltration Given by Other 07/01/16 2018)  Tdap (BOOSTRIX) injection 0.5 mL (0.5 mLs Intramuscular Given 07/01/16 2030)     ____________________________________________   INITIAL IMPRESSION / ASSESSMENT AND PLAN / ED COURSE  Patient's diagnosis is consistent with Lip laceration and need for tetanus booster. Patient will be discharged home with prescriptions for Keflex and Norco to take as directed. Patient is to follow up with her primary care provider in 48 hours for wound recheck. Patient is given ED precautions to return to the ED for any worsening or new symptoms.      ____________________________________________  FINAL CLINICAL IMPRESSION(S) / ED DIAGNOSES  Final diagnoses:  Lip laceration, initial encounter  Need for tetanus booster       NEW MEDICATIONS STARTED DURING THIS VISIT:  Discharge Medication List as of 07/01/2016  8:17 PM    START taking these medications   Details  cephALEXin (KEFLEX) 500 MG capsule Take 1 capsule (500 mg total) by mouth 3 (three) times daily., Starting 07/01/2016, Until Discontinued, Print    HYDROcodone-acetaminophen (NORCO) 5-325 MG tablet Take 1 tablet by mouth every 6 (six) hours as needed for severe pain., Starting 07/01/2016, Until Discontinued, Print             Hope PigeonJami L Marcele Kosta, PA-C 07/01/16 [id number]  Phineas SemenGraydon Goodman, MD 07/02/16 [phone]

## 2016-08-28 ENCOUNTER — Encounter: Payer: Self-pay | Admitting: Emergency Medicine

## 2016-08-28 ENCOUNTER — Emergency Department: Payer: Medicaid Other

## 2016-08-28 ENCOUNTER — Emergency Department
Admission: EM | Admit: 2016-08-28 | Discharge: 2016-08-28 | Disposition: A | Discharge status: Home / Self Care | Payer: Medicaid Other | Attending: Emergency Medicine | Admitting: Emergency Medicine

## 2016-08-28 DIAGNOSIS — R1013 Epigastric pain: Secondary | ICD-10-CM

## 2016-08-28 DIAGNOSIS — F17200 Nicotine dependence, unspecified, uncomplicated: Secondary | ICD-10-CM | POA: Diagnosis not present

## 2016-08-28 DIAGNOSIS — R1010 Upper abdominal pain, unspecified: Secondary | ICD-10-CM | POA: Diagnosis present

## 2016-08-28 LAB — COMPREHENSIVE METABOLIC PANEL
ALBUMIN: 4.3 g/dL (ref 3.5–5.0)
ALT: 17 U/L (ref 14–54)
AST: 19 U/L (ref 15–41)
Alkaline Phosphatase: 41 U/L (ref 38–126)
Anion gap: 5 (ref 5–15)
BUN: 7 mg/dL (ref 6–20)
CHLORIDE: 106 mmol/L (ref 101–111)
CO2: 26 mmol/L (ref 22–32)
CREATININE: 0.5 mg/dL (ref 0.44–1.00)
Calcium: 9 mg/dL (ref 8.9–10.3)
GFR calc Af Amer: >60 mL/min (ref >60)
GFR calc non Af Amer: >60 mL/min (ref >60)
Glucose, Bld: 132 mg/dL — ABNORMAL HIGH (ref 65–99)
Potassium: 3.3 mmol/L — ABNORMAL LOW (ref 3.5–5.1)
SODIUM: 137 mmol/L (ref 135–145)
Total Bilirubin: 0.4 mg/dL (ref 0.3–1.2)
Total Protein: 7 g/dL (ref 6.5–8.1)

## 2016-08-28 LAB — URINALYSIS COMPLETE WITH MICROSCOPIC (ARMC ONLY)
BILIRUBIN URINE: NEGATIVE
Glucose, UA: NEGATIVE mg/dL
Hgb urine dipstick: NEGATIVE
KETONES UR: NEGATIVE mg/dL
Leukocytes, UA: NEGATIVE
Nitrite: NEGATIVE
PROTEIN: NEGATIVE mg/dL
RBC / HPF: NONE SEEN RBC/hpf (ref 0–5)
Specific Gravity, Urine: 1.01 (ref 1.005–1.030)
pH: 7 (ref 5.0–8.0)

## 2016-08-28 LAB — CBC
HEMATOCRIT: 26.5 % — AB (ref 35.0–47.0)
Hemoglobin: 8.3 g/dL — ABNORMAL LOW (ref 12.0–16.0)
MCH: 22.9 pg — AB (ref 26.0–34.0)
MCHC: 31.4 g/dL — ABNORMAL LOW (ref 32.0–36.0)
MCV: 73 fL — AB (ref 80.0–100.0)
PLATELETS: 373 10*3/uL (ref 150–440)
RBC: 3.63 MIL/uL — ABNORMAL LOW (ref 3.80–5.20)
RDW: 20.3 % — AB (ref 11.5–14.5)
WBC: 5.7 10*3/uL (ref 3.6–11.0)

## 2016-08-28 LAB — PREGNANCY, URINE: Preg Test, Ur: NEGATIVE

## 2016-08-28 LAB — LIPASE, BLOOD: LIPASE: 21 U/L (ref 11–51)

## 2016-08-28 MED ORDER — IOPAMIDOL (ISOVUE-300) INJECTION 61%
30.0000 mL | Freq: Once | INTRAVENOUS | Status: AC | PRN
  Administered 2016-08-28: 30 mL via ORAL
  Filled 2016-08-28: qty 30

## 2016-08-28 MED ORDER — ONDANSETRON HCL 4 MG/2ML IJ SOLN
4.0000 mg | Freq: Once | INTRAMUSCULAR | Status: AC
  Administered 2016-08-28: 4 mg via INTRAVENOUS
  Filled 2016-08-28: qty 2

## 2016-08-28 MED ORDER — OXYCODONE-ACETAMINOPHEN 5-325 MG PO TABS: 1.0000 | ORAL_TABLET | ORAL | 0 refills | Status: DC | PRN

## 2016-08-28 MED ORDER — RANITIDINE HCL 150 MG PO TABS: 150.0000 mg | ORAL_TABLET | Freq: Every day | ORAL | 0 refills | Status: DC

## 2016-08-28 MED ORDER — MORPHINE SULFATE (PF) 4 MG/ML IV SOLN
4.0000 mg | Freq: Once | INTRAVENOUS | Status: AC
Start: 2016-08-28 — End: 2016-08-28
  Administered 2016-08-28: 4 mg via INTRAVENOUS
  Filled 2016-08-28: qty 1

## 2016-08-28 MED ORDER — IOPAMIDOL (ISOVUE-300) INJECTION 61%
100.0000 mL | Freq: Once | INTRAVENOUS | Status: AC | PRN
  Administered 2016-08-28: 100 mL via INTRAVENOUS
  Filled 2016-08-28: qty 100

## 2016-08-28 NOTE — ED Notes (Signed)
POC occult blood stool collected by this nurse, guiac negative, Dr. Fanny BienQuale informed.

## 2016-08-28 NOTE — ED Triage Notes (Addendum)
C/o generalized abdominal pain X 1 week. Reports vomiting, none today. Denies urinary problems. Seen at [facility] days [facility] for same.

## 2016-08-28 NOTE — ED Provider Notes (Signed)
[facility] Emergency Department Provider Note   ____________________________________________   First MD Initiated Contact with Patient 08/28/16 1845     (approximate)  I have reviewed the triage vital signs and the nursing notes.   HISTORY  Chief Complaint Abdominal Pain    HPI Carrie Johnson is a 25 y.o. female here for evaluation of abdominal pain. Reports for about one week she's been having a constant severe pain in the upper abdomen, not relieved by Percocet or antibiotic which she was given at [facility] 2 days ago.  Patient reports she has a same ongoing symptoms of a somewhat hard to describe but severe pain in the upper abdomen. Patient's mother reports she had similar due to heavy drinking in the past, and possibly to an ulcer however the patient reports she has not had alcohol for over a month and a half.  Patient denies any chest pain or shortness of breath. Denies pregnancy. No vaginal discharge or lower abdominal pain. She describes the pain, and points to her epigastrium, she reports is rather unremitting associated with nausea. She denies any black or bloody stools. She has a history of fairly heavy menstrual cycles, but reports that that has now and it.   History reviewed. No pertinent past medical history.  There are no active problems to display for this patient.   Past Surgical History:  Procedure Laterality Date  . CESAREAN SECTION     x2    Prior to Admission medications   Medication Sig Start Date End Date Taking? Authorizing Provider  oxyCODONE-acetaminophen (ROXICET) 5-325 MG tablet Take 1 tablet by mouth every 4 (four) hours as needed for severe pain. 08/28/16   Sharyn Creamer, MD  ranitidine (ZANTAC) 150 MG tablet Take 1 tablet (150 mg total) by mouth at bedtime. 08/28/16 08/28/17  Sharyn Creamer, MD  Patient is currently taking Percocet only.  Allergies Review of patient's allergies indicates no known allergies.  History  reviewed. No pertinent family history.  Social History Social History  Substance Use Topics  . Smoking status: Current Every Day Smoker  . Smokeless tobacco: Not on file  . Alcohol use No    Review of Systems Constitutional: No fever/chills Eyes: No visual changes. ENT: No sore throat. Cardiovascular: Denies chest pain. Respiratory: Denies shortness of breath. Gastrointestinal: No vomiting.  No diarrhea.  No constipation. Genitourinary: Negative for dysuria. Musculoskeletal: Negative for back pain. Skin: Negative for rash. Neurological: Negative for headaches, focal weakness or numbness.  10-point ROS otherwise negative.  ____________________________________________   PHYSICAL EXAM:  VITAL SIGNS: ED Triage Vitals  Enc Vitals Group     BP 08/28/16 1626 124/75     Pulse Rate 08/28/16 1626 73     Resp 08/28/16 1626 16     Temp 08/28/16 1626 98.2 F (36.8 C)     Temp src --      SpO2 08/28/16 1626 100 %     Weight 08/28/16 1625 115 lb (52.2 kg)     Height 08/28/16 1625 5\' 2"  (1.575 m)     Head Circumference --      Peak Flow --      Pain Score 08/28/16 1625 10     Pain Loc --      Pain Edu? --      Excl. in GC? --     Constitutional: Alert and oriented. Well appearing and in no acute distress. Eyes: Conjunctivae are normal. PERRL. EOMI. Head: Atraumatic. Nose: No congestion/rhinnorhea. Mouth/Throat: Mucous  membranes are moist.  Oropharynx non-erythematous. Neck: No stridor.   Cardiovascular: Normal rate, regular rhythm. Grossly normal heart sounds.  Good peripheral circulation. Respiratory: Normal respiratory effort.  No retractions. Lungs CTAB. Gastrointestinal: Soft and nontenderExcept for some mild epigastric tenderness. Negative Murphy. No rebound or guarding. No pain throughout the lower abdomen at McBurney's point. No distention. No abdominal bruits. No CVA tenderness. Rectal guaiac performed by RN Wynona Canes. Negative for blood. Neg for  heme. Musculoskeletal: No lower extremity tenderness nor edema.  No joint effusions. Neurologic:  Normal speech and language. No gross focal neurologic deficits are appreciated. Skin:  Skin is warm, dry and intact. No rash noted. Psychiatric: Mood and affect are normal. Speech and behavior are normal.  ____________________________________________   LABS (all labs ordered are listed, but only abnormal results are displayed)  Labs Reviewed  COMPREHENSIVE METABOLIC PANEL - Abnormal; Notable for the following:       Result Value   Potassium 3.3 (*)    Glucose, Bld 132 (*)    All other components within normal limits  CBC - Abnormal; Notable for the following:    RBC 3.63 (*)    Hemoglobin 8.3 (*)    HCT 26.5 (*)    MCV 73.0 (*)    MCH 22.9 (*)    MCHC 31.4 (*)    RDW 20.3 (*)    All other components within normal limits  URINALYSIS COMPLETEWITH MICROSCOPIC ([facility] ONLY) - Abnormal; Notable for the following:    Color, Urine STRAW (*)    APPearance CLEAR (*)    Bacteria, UA RARE (*)    Squamous Epithelial / LPF 0-5 (*)    All other components within normal limits  LIPASE, BLOOD  PREGNANCY, URINE   ____________________________________________  EKG   ____________________________________________  RADIOLOGY  Ct Abdomen Pelvis W Contrast  Result Date: 08/28/2016 CLINICAL DATA:  25 year old female with abdominal pain with vomiting for 1 week. EXAM: CT ABDOMEN AND PELVIS WITH CONTRAST TECHNIQUE: Multidetector CT imaging of the abdomen and pelvis was performed using the standard protocol following bolus administration of intravenous contrast. CONTRAST:  ISOVUE-300 IOPAMIDOL (ISOVUE-300) INJECTION 61% COMPARISON:  05/04/2011 FINDINGS: Lower chest: Unremarkable Hepatobiliary: The liver and gallbladder are unremarkable. No biliary dilatation identified. Pancreas: Unremarkable Spleen: Unremarkable Adrenals/Urinary Tract: The kidneys, adrenal glands and bladder are unremarkable.  Stomach/Bowel: A moderate amount of stool within the ascending and transverse colon noted. There is no evidence of bowel obstruction or definite bowel wall thickening. The appendix is normal. Vascular/Lymphatic: Unremarkable. No abdominal aortic aneurysm or enlarged lymph nodes. Reproductive: Unremarkable. Other: Small amount of free pelvic fluid is nonspecific but may be physiologic. No focal collection or pneumoperitoneum. Musculoskeletal: No acute or suspicious abnormality. 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 IMPRESSION: Small amount of free pelvic fluid which is nonspecific but may be physiologic. No other acute abnormality identified. Moderate amount of stool throughout the ascending and transverse colon. Electronically Signed   By: Harmon Pier M.D.   On: 08/28/2016 21:59    ____________________________________________   PROCEDURES  Procedure(s) performed: None  Procedures  Critical Care performed: No  ____________________________________________   INITIAL IMPRESSION / ASSESSMENT AND PLAN / ED COURSE  Pertinent labs & imaging results that were available during my care of the patient were reviewed by me and considered in my medical decision making (see chart for details).  Differential diagnosis includes but is not limited to, abdominal perforation, aortic dissection, cholecystitis, appendicitis, diverticulitis, colitis, esophagitis/gastritis, kidney stone, pyelonephritis, urinary tract infection, aortic  aneurysm. All are considered in decision and treatment plan. Based upon the patient's presentation and risk factors, I suspect gastritis or peptic ulcer disease based on clinical history and exam. Very stable and nontoxic appearing. Relatively low hemoglobin, certainly not meeting any transfusion criteria, and she denies black or bloody stools. Denies hematemesis. The patient's Hemoccult is negative.  No evidence of active bleeding, but I suspect she may have a  painful gastric ulcer as a leading etiology. She denies any gynecologic symptoms or lower abdominal pain. She was, and she reports Percocet was helping with her discomfort but she ran out. Discussed with her, and also with her mother, and she will talk to her primary at [facility] tomorrow during they're open hours on Saturday. She will follow-up closely with them as well as gastroenterology. She is agreeable to safe use of Percocet, and tells me she is not taking any other medications other than what sounds like a PPI at this time.  ----------------------------------------- 10:40 PM on 08/28/2016 -----------------------------------------  Patient is resting comfortably. I will prescribe the patient a narcotic pain medicine due to their condition which I anticipate will cause at least moderate pain short term. I discussed with the patient safe use of narcotic pain medicines, and that they are not to drive, work in dangerous areas, or ever take more than prescribed (no more than 1 pill every 6 hours). We discussed that this is the type of medication that can be  overdosed on and the risks of this type of medicine. Patient is very agreeable to only use as prescribed and to never use more than prescribed.  Patient agreeable with plan for discharge, mother will be driving her home. Follow up closely with her primary care doctor in the morning during their Saturday hours. Also with gastroenterology. Careful return precautions discussed.   Clinical Course     ____________________________________________   FINAL CLINICAL IMPRESSION(S) / ED DIAGNOSES  Final diagnoses:  Epigastric abdominal pain      NEW MEDICATIONS STARTED DURING THIS VISIT:  New Prescriptions   OXYCODONE-ACETAMINOPHEN (ROXICET) 5-325 MG TABLET    Take 1 tablet by mouth every 4 (four) hours as needed for severe pain.   RANITIDINE (ZANTAC) 150 MG TABLET    Take 1 tablet (150 mg total) by mouth at bedtime.     Note:  This  document was prepared using Dragon voice recognition software and may include unintentional dictation errors.     Sharyn CreamerMark Terrall Bley, MD 08/28/16 2240

## 2016-08-28 NOTE — Discharge Instructions (Signed)
You were seen in the emergency room for abdominal pain. It is important that you follow up closely with your primary care doctor in the next couple of days. As you indicated, please call your primary doctor tomorrow during their weekend hours to setup close follow-up.  Continue the prescription acid medicine you were given prior.  If you're unable to see your primary care doctor you may return to the emergency room or go to the [facility] in 1 or 2 days for reexam.  Please return to the emergency room right away if you are to develop a fever, severe nausea, your pain becomes severe or worsens, you are unable to keep food down, begin vomiting any dark or bloody fluid, you develop any dark or bloody stools, feel dehydrated, or other new concerns or symptoms arise.

## 2016-10-05 ENCOUNTER — Ambulatory Visit: Payer: Self-pay | Admitting: Gastroenterology

## 2016-12-28 ENCOUNTER — Emergency Department: Payer: Medicaid Other

## 2016-12-28 ENCOUNTER — Emergency Department
Admission: EM | Admit: 2016-12-28 | Discharge: 2016-12-28 | Disposition: A | Discharge status: Home / Self Care | Payer: Medicaid Other | Attending: Emergency Medicine | Admitting: Emergency Medicine

## 2016-12-28 ENCOUNTER — Encounter: Payer: Self-pay | Admitting: Emergency Medicine

## 2016-12-28 DIAGNOSIS — F17200 Nicotine dependence, unspecified, uncomplicated: Secondary | ICD-10-CM | POA: Insufficient documentation

## 2016-12-28 DIAGNOSIS — Z79899 Other long term (current) drug therapy: Secondary | ICD-10-CM | POA: Diagnosis not present

## 2016-12-28 DIAGNOSIS — M722 Plantar fascial fibromatosis: Secondary | ICD-10-CM | POA: Diagnosis not present

## 2016-12-28 DIAGNOSIS — M79672 Pain in left foot: Secondary | ICD-10-CM | POA: Diagnosis present

## 2016-12-28 MED ORDER — TRAMADOL HCL 50 MG PO TABS
50.0000 mg | ORAL_TABLET | Freq: Once | ORAL | Status: AC
  Administered 2016-12-28: 50 mg via ORAL
  Filled 2016-12-28: qty 1

## 2016-12-28 MED ORDER — NAPROXEN 500 MG PO TABS: 500.0000 mg | ORAL_TABLET | Freq: Two times a day (BID) | ORAL | 0 refills | Status: AC

## 2016-12-28 NOTE — ED Provider Notes (Signed)
[facility] Emergency Department Provider Note  ____________________________________________  Time seen: Approximately 1:14 PM  I have reviewed the triage vital signs and the nursing notes.   HISTORY  Chief Complaint Foot Pain    HPI Carrie Johnson is a 26 y.o. female presents the emergency department with left foot pain since this morning. Patient states she woke up this morning and it was immediately painful to step on left foot. Patient has not been able to walk all day. Patient states that pain shoots throughout foot but is primarily all over bottom of foot and over heel. Patient is able to move toes and ankle normally. She has not taken anything for symptoms. Patient denies numbness, tingling.   History reviewed. No pertinent past medical history.  There are no active problems to display for this patient.   Past Surgical History:  Procedure Laterality Date  . CESAREAN SECTION     x2    Prior to Admission medications   Medication Sig Start Date End Date Taking? Authorizing Provider  naproxen (NAPROSYN) 500 MG tablet Take 1 tablet (500 mg total) by mouth 2 (two) times daily with a meal. 12/28/16 12/28/17  Enid DerryAshley Sonal Dorwart, PA-C  ranitidine (ZANTAC) 150 MG tablet Take 1 tablet (150 mg total) by mouth at bedtime. 08/28/16 08/28/17  Sharyn CreamerMark Quale, MD    Allergies Patient has no known allergies.  No family history on file.  Social History Social History  Substance Use Topics  . Smoking status: Current Every Day Smoker  . Smokeless tobacco: Not on file  . Alcohol use No     Review of Systems  Constitutional: No fever/chills ENT: No upper respiratory complaints. Cardiovascular: No chest pain. Respiratory: No SOB. Gastrointestinal: No abdominal pain.  No nausea, no vomiting.  Skin: Negative for rash, abrasions, lacerations, ecchymosis. Neurological: Negative for headaches, numbness or  tingling   ____________________________________________   PHYSICAL EXAM:  VITAL SIGNS: ED Triage Vitals  Enc Vitals Group     BP 12/28/16 1037 117/82     Pulse Rate 12/28/16 1037 86     Resp 12/28/16 1037 18     Temp 12/28/16 1037 98.3 F (36.8 C)     Temp Source 12/28/16 1037 Oral     SpO2 12/28/16 1037 100 %     Weight 12/28/16 1034 115 lb (52.2 kg)     Height 12/28/16 1034 5\' 2"  (1.575 m)     Head Circumference --      Peak Flow --      Pain Score 12/28/16 1035 10     Pain Loc --      Pain Edu? --      Excl. in GC? --      Constitutional: Alert and oriented. Well appearing and in no acute distress. Eyes: Conjunctivae are normal. PERRL. EOMI. Head: Atraumatic. ENT:      Ears:      Nose: No congestion/rhinnorhea.      Mouth/Throat: Mucous membranes are moist.  Neck: No stridor.  Cardiovascular: Normal rate, regular rhythm. Normal S1 and S2.  Good peripheral circulation. 2+ dorsalis pedis and posterior tibialis pulses Respiratory: Normal respiratory effort without tachypnea or retractions. Lungs CTAB. Good air entry to the bases with no decreased or absent breath sounds. Musculoskeletal: Full range of motion to all extremities. No tenderness to palpation across top of foot. Tenderness to palpation over the arch of foot and into heel. Pain over bottom of foot when walking. Neurologic:  Normal speech and language. No  gross focal neurologic deficits are appreciated. Sensation of feet intact. Skin:  Skin is warm, dry and intact. No rash noted. Psychiatric: Mood and affect are normal. Speech and behavior are normal. Patient exhibits appropriate insight and judgement.   ____________________________________________   LABS (all labs ordered are listed, but only abnormal results are displayed)  Labs Reviewed - No data to display ____________________________________________  EKG   ____________________________________________  RADIOLOGY Lexine Baton, personally  viewed and evaluated these images (plain radiographs) as part of my medical decision making, as well as reviewing the written report by the radiologist.  Dg Foot Complete Left  Result Date: 12/28/2016 CLINICAL DATA:  Left foot pain starting this morning after walking no known injury EXAM: LEFT FOOT - COMPLETE 3+ VIEW COMPARISON:  None. FINDINGS: Three views of the left foot submitted. No acute fracture or subluxation. No radiopaque foreign body. IMPRESSION: Negative. Electronically Signed   By: Natasha Mead M.D.   On: 12/28/2016 11:40    ____________________________________________    PROCEDURES  Procedure(s) performed:    Procedures    Medications  traMADol (ULTRAM) tablet 50 mg (50 mg Oral Given 12/28/16 1122)     ____________________________________________   INITIAL IMPRESSION / ASSESSMENT AND PLAN / ED COURSE  Pertinent labs & imaging results that were available during my care of the patient were reviewed by me and considered in my medical decision making (see chart for details).  Review of the [place] CSRS was performed in accordance of the [facility] prior to dispensing any controlled drugs.  Clinical Course     Patient's diagnosis is consistent with plantar fasciitis. Patient's foot was Ace wrapped and crutches were given. Patient will be discharged home with prescriptions for naproxen. Patient is to follow up with PCP as directed. Patient is given ED precautions to return to the ED for any worsening or new symptoms.     ____________________________________________  FINAL CLINICAL IMPRESSION(S) / ED DIAGNOSES  Final diagnoses:  Plantar fasciitis      NEW MEDICATIONS STARTED DURING THIS VISIT:  Discharge Medication List as of 12/28/2016 12:25 PM    START taking these medications   Details  naproxen (NAPROSYN) 500 MG tablet Take 1 tablet (500 mg total) by mouth 2 (two) times daily with a meal., Starting Mon 12/28/2016, Until Tue 12/28/2017, Print             This chart was dictated using voice recognition software/Dragon. Despite best efforts to proofread, errors can occur which can change the meaning. Any change was purely unintentional.    Enid Derry, PA-C 12/28/16 1319    Governor Rooks, MD 12/28/16 [phone]

## 2016-12-28 NOTE — ED Notes (Signed)
See triage note  Woke up with left foot pain w/o injury  No deformity noted positive pulses

## 2016-12-28 NOTE — ED Triage Notes (Signed)
Pt reports this am awoke with pain to left foot. Denies swelling but reports difficult to walk on.

## 2017-01-12 ENCOUNTER — Encounter: Payer: Self-pay | Admitting: *Deleted

## 2017-01-13 ENCOUNTER — Encounter: Payer: Self-pay | Admitting: *Deleted

## 2017-01-13 ENCOUNTER — Ambulatory Visit
Admission: RE | Admit: 2017-01-13 | Discharge: 2017-01-13 | Disposition: A | Discharge status: Home / Self Care | Payer: Medicaid Other | Source: Ambulatory Visit | Attending: Unknown Physician Specialty | Admitting: Unknown Physician Specialty

## 2017-01-13 ENCOUNTER — Ambulatory Visit: Payer: Medicaid Other | Admitting: Anesthesiology

## 2017-01-13 ENCOUNTER — Encounter
Admission: RE | Disposition: A | Discharge status: Home / Self Care | Payer: Self-pay | Source: Ambulatory Visit | Attending: Unknown Physician Specialty

## 2017-01-13 DIAGNOSIS — D509 Iron deficiency anemia, unspecified: Secondary | ICD-10-CM | POA: Insufficient documentation

## 2017-01-13 DIAGNOSIS — K2980 Duodenitis without bleeding: Secondary | ICD-10-CM | POA: Insufficient documentation

## 2017-01-13 DIAGNOSIS — K2950 Unspecified chronic gastritis without bleeding: Secondary | ICD-10-CM | POA: Insufficient documentation

## 2017-01-13 DIAGNOSIS — K264 Chronic or unspecified duodenal ulcer with hemorrhage: Secondary | ICD-10-CM | POA: Diagnosis not present

## 2017-01-13 DIAGNOSIS — F17200 Nicotine dependence, unspecified, uncomplicated: Secondary | ICD-10-CM | POA: Diagnosis not present

## 2017-01-13 DIAGNOSIS — R1013 Epigastric pain: Secondary | ICD-10-CM | POA: Diagnosis present

## 2017-01-13 DIAGNOSIS — K228 Other specified diseases of esophagus: Secondary | ICD-10-CM | POA: Insufficient documentation

## 2017-01-13 DIAGNOSIS — Z79899 Other long term (current) drug therapy: Secondary | ICD-10-CM | POA: Insufficient documentation

## 2017-01-13 HISTORY — DX: Anemia, unspecified: D64.9

## 2017-01-13 HISTORY — DX: Depression, unspecified: F32.A

## 2017-01-13 HISTORY — DX: Major depressive disorder, single episode, unspecified: F32.9

## 2017-01-13 HISTORY — PX: ESOPHAGOGASTRODUODENOSCOPY (EGD) WITH PROPOFOL: SHX5813

## 2017-01-13 HISTORY — DX: Chlamydial infection, unspecified: A74.9

## 2017-01-13 LAB — POCT PREGNANCY, URINE: PREG TEST UR: NEGATIVE

## 2017-01-13 SURGERY — ESOPHAGOGASTRODUODENOSCOPY (EGD) WITH PROPOFOL
Anesthesia: General

## 2017-01-13 MED ORDER — GLYCOPYRROLATE 0.2 MG/ML IJ SOLN
INTRAMUSCULAR | Status: DC | PRN
  Administered 2017-01-13: 0.2 mg via INTRAVENOUS

## 2017-01-13 MED ORDER — LIDOCAINE HCL (PF) 2 % IJ SOLN
INTRAMUSCULAR | Status: DC | PRN
  Administered 2017-01-13: 50 mg

## 2017-01-13 MED ORDER — PROPOFOL 10 MG/ML IV BOLUS
INTRAVENOUS | Status: DC | PRN
  Administered 2017-01-13: 50 mg via INTRAVENOUS

## 2017-01-13 MED ORDER — FENTANYL CITRATE (PF) 100 MCG/2ML IJ SOLN
INTRAMUSCULAR | Status: AC
  Filled 2017-01-13: qty 2

## 2017-01-13 MED ORDER — FENTANYL CITRATE (PF) 100 MCG/2ML IJ SOLN
INTRAMUSCULAR | Status: DC | PRN
  Administered 2017-01-13 (×2): 50 ug via INTRAVENOUS

## 2017-01-13 MED ORDER — PROPOFOL 500 MG/50ML IV EMUL
INTRAVENOUS | Status: AC
  Filled 2017-01-13: qty 50

## 2017-01-13 MED ORDER — SODIUM CHLORIDE 0.9 % IV SOLN
INTRAVENOUS | Status: DC
  Administered 2017-01-13: 13:00:00 via INTRAVENOUS

## 2017-01-13 MED ORDER — MIDAZOLAM HCL 5 MG/5ML IJ SOLN
INTRAMUSCULAR | Status: DC | PRN
  Administered 2017-01-13: 2 mg via INTRAVENOUS

## 2017-01-13 MED ORDER — MIDAZOLAM HCL 2 MG/2ML IJ SOLN
INTRAMUSCULAR | Status: AC
  Filled 2017-01-13: qty 2

## 2017-01-13 MED ORDER — PROPOFOL 500 MG/50ML IV EMUL
INTRAVENOUS | Status: DC | PRN
  Administered 2017-01-13: 100 ug/kg/min via INTRAVENOUS

## 2017-01-13 MED ORDER — SODIUM CHLORIDE 0.9 % IV SOLN: INTRAVENOUS | Status: DC

## 2017-01-13 NOTE — Anesthesia Postprocedure Evaluation (Signed)
Anesthesia Post Note  Patient: Carrie Johnson  Procedure(s) Performed: Procedure(s) (LRB): ESOPHAGOGASTRODUODENOSCOPY (EGD) WITH PROPOFOL (N/A)  Patient location during evaluation: PACU Anesthesia Type: General Level of consciousness: awake Pain management: pain level controlled Vital Signs Assessment: post-procedure vital signs reviewed and stable Respiratory status: spontaneous breathing Cardiovascular status: stable Anesthetic complications: no     Last Vitals:  Vitals:   01/13/17 1436 01/13/17 1446  BP: 128/89 121/79  Pulse: 79 71  Resp: 15 16  Temp:      Last Pain:  Vitals:   01/13/17 1426  TempSrc: Tympanic                 VAN STAVEREN,Odella Appelhans

## 2017-01-13 NOTE — Op Note (Signed)
[facility] Patient Name: Carrie Johnson Procedure Date: 01/13/2017 1:56 PM MRN: [id number] Account #: [id number] Date of Birth: 06-24-1991 Admit Type: Outpatient Age: 26 Room: [facility] ENDO ROOM 4 Gender: Female Note Status: Finalized Procedure:            Upper GI endoscopy Indications:          Epigastric abdominal pain, Unexplained iron deficiency                        anemia Providers:            Scot Jun, MD Referring MD:         [facility] (Referring MD) Medicines:            Propofol per Anesthesia Complications:        No immediate complications. Procedure:            Pre-Anesthesia Assessment:                       - After reviewing the risks and benefits, the patient                        was deemed in satisfactory condition to undergo the                        procedure.                       After obtaining informed consent, the endoscope was                        passed under direct vision. Throughout the procedure,                        the patient's blood pressure, pulse, and oxygen                        saturations were monitored continuously. The Endoscope                        was introduced through the mouth, and advanced to the                        second part of duodenum. The upper GI endoscopy was                        accomplished without difficulty. The patient tolerated                        the procedure well. The upper GI endoscopy was                        accomplished without difficulty. The patient tolerated                        the procedure well. Findings:      Diffuse mild erythema was found in the lower third of the esophagus.      Diffuse mild inflammation characterized by congestion (edema), erythema       and granularity was found in the gastric body. Biopsies were taken with  a cold forceps for histology. Biopsies were taken from the stomach body       with a cold  forceps for Helicobacter pylori testing.      Diffuse mild inflammation characterized by erythema and granularity was       found in the duodenal bulb.      One non-bleeding linear and superficial duodenal ulcer with no stigmata       of bleeding was found in the duodenal bulb. Impression:           - Erythema in the lower third of the esophagus.                       - Gastritis. Biopsied.                       - Duodenitis.                       - One non-bleeding duodenal ulcer with no stigmata of                        bleeding. Recommendation:       - Continue present medications. Scot Junobert T Kahdijah Errickson, MD 01/13/2017 2:23:13 PM This report has been signed electronically. Number of Addenda: 0 Note Initiated On: 01/13/2017 1:56 PM      [facility]

## 2017-01-13 NOTE — Transfer of Care (Signed)
Immediate Anesthesia Transfer of Care Note  Patient: Carrie Johnson  Procedure(s) Performed: Procedure(s): ESOPHAGOGASTRODUODENOSCOPY (EGD) WITH PROPOFOL (N/A)  Patient Location: PACU  Anesthesia Type:General  Level of Consciousness: sedated  Airway & Oxygen Therapy: Patient Spontanous Breathing and Patient connected to nasal cannula oxygen  Post-op Assessment: Report given to RN and Post -op Vital signs reviewed and stable  Post vital signs: Reviewed and stable  Last Vitals:  Vitals:   01/13/17 1246  BP: 132/78  Pulse: 76  Resp: 20  Temp: 36.4 C    Last Pain:  Vitals:   01/13/17 1246  TempSrc: Tympanic         Complications: No apparent anesthesia complications

## 2017-01-13 NOTE — H&P (Signed)
   Primary Care Physician:  [facility] Primary Gastroenterologist:  Dr. Mechele CollinElliott  Pre-Procedure History & Physical: HPI:  Carrie MangesShikira L Johnson is a 26 y.o. female is here for an endoscopy.   Past Medical History:  Diagnosis Date  . Anemia   . Chlamydia   . Depression     Past Surgical History:  Procedure Laterality Date  . CESAREAN SECTION     x2    Prior to Admission medications   Medication Sig Start Date End Date Taking? Authorizing Provider  pantoprazole (PROTONIX) 40 MG tablet Take 40 mg by mouth 2 (two) times daily before a meal.   Yes Historical Provider, MD  ranitidine (ZANTAC) 150 MG tablet Take 1 tablet (150 mg total) by mouth at bedtime. 08/28/16 08/28/17 Yes Sharyn CreamerMark Quale, MD  sucralfate (CARAFATE) 1 g tablet Take 1 g by mouth 4 (four) times daily.   Yes Historical Provider, MD  Acetaminophen 500 MG coapsule Take 1 capsule by mouth as needed for fever.    Historical Provider, MD  naproxen (NAPROSYN) 500 MG tablet Take 1 tablet (500 mg total) by mouth 2 (two) times daily with a meal. Patient not taking: Reported on 01/13/2017 12/28/16 12/28/17  Enid DerryAshley Wagner, PA-C    Allergies as of 01/11/2017  . (No Known Allergies)    History reviewed. No pertinent family history.  Social History   Social History  . Marital status: Legally Separated    Spouse name: N/A  . Number of children: N/A  . Years of education: N/A   Occupational History  . Not on file.   Social History Main Topics  . Smoking status: Current Every Day Smoker    Packs/day: 0.50    Years: 10.00    Types: Cigarettes  . Smokeless tobacco: Never Used  . Alcohol use 0.6 oz/week    1 Shots of liquor per week  . Drug use: No  . Sexual activity: Not on file   Other Topics Concern  . Not on file   Social History Narrative  . No narrative on file    Review of Systems: See HPI, otherwise negative ROS  Physical Exam: BP 132/78   Pulse 76   Temp 97.6 F (36.4 C) (Tympanic)   Resp 20   Ht 5'  2" (1.575 m)   Wt 54.4 kg (120 lb)   SpO2 100%   Breastfeeding? No   BMI 21.95 kg/m  General:   Alert,  pleasant and cooperative in NAD Head:  Normocephalic and atraumatic. Neck:  Supple; no masses or thyromegaly. Lungs:  Clear throughout to auscultation.    Heart:  Regular rate and rhythm. Abdomen:  Soft, nontender and nondistended. Normal bowel sounds, without guarding, and without rebound.   Neurologic:  Alert and  oriented x4;  grossly normal neurologically.  Impression/Plan: Carrie Johnson is here for an endoscopy to be performed for iron def anemia and epigastric abd pain.  Risks, benefits, limitations, and alternatives regarding  endoscopy have been reviewed with the patient.  Questions have been answered.  All parties agreeable.   Lynnae PrudeELLIOTT, Rakeen Gaillard, MD  01/13/2017, 2:04 PM

## 2017-01-13 NOTE — Anesthesia Preprocedure Evaluation (Signed)
Anesthesia Evaluation  Patient identified by MRN, date of birth, ID band Patient awake    Reviewed: Allergy & Precautions, NPO status , Patient's Chart, lab work & pertinent test results  History of Anesthesia Complications Negative for: history of anesthetic complications  Airway Mallampati: I  TM Distance: >3 FB Neck ROM: Full    Dental no notable dental hx.    Pulmonary neg sleep apnea, neg COPD, Current Smoker,    breath sounds clear to auscultation- rhonchi (-) wheezing      Cardiovascular Exercise Tolerance: Good (-) hypertension(-) CAD and (-) Past MI  Rhythm:Regular Rate:Normal - Systolic murmurs and - Diastolic murmurs    Neuro/Psych PSYCHIATRIC DISORDERS Depression negative neurological ROS     GI/Hepatic negative GI ROS, Neg liver ROS,   Endo/Other  negative endocrine ROSneg diabetes  Renal/GU negative Renal ROS     Musculoskeletal negative musculoskeletal ROS (+)   Abdominal (+) - obese,   Peds  Hematology  (+) anemia ,   Anesthesia Other Findings   Reproductive/Obstetrics                             Anesthesia Physical Anesthesia Plan  ASA: II  Anesthesia Plan: General   Post-op Pain Management:    Induction: Intravenous  Airway Management Planned: Natural Airway  Additional Equipment:   Intra-op Plan:   Post-operative Plan:   Informed Consent: I have reviewed the patients History and Physical, chart, labs and discussed the procedure including the risks, benefits and alternatives for the proposed anesthesia with the patient or authorized representative who has indicated his/her understanding and acceptance.   Dental advisory given  Plan Discussed with: CRNA and Anesthesiologist  Anesthesia Plan Comments:         Anesthesia Quick Evaluation

## 2017-01-13 NOTE — Anesthesia Post-op Follow-up Note (Cosign Needed)
Anesthesia QCDR form completed.        

## 2017-01-14 ENCOUNTER — Encounter: Payer: Self-pay | Admitting: Unknown Physician Specialty

## 2017-01-15 LAB — SURGICAL PATHOLOGY

## 2017-01-23 ENCOUNTER — Encounter: Payer: Self-pay | Admitting: Medical Oncology

## 2017-01-23 ENCOUNTER — Emergency Department
Admission: EM | Admit: 2017-01-23 | Discharge: 2017-01-23 | Disposition: A | Discharge status: Home / Self Care | Payer: Medicaid Other | Attending: Emergency Medicine | Admitting: Emergency Medicine

## 2017-01-23 DIAGNOSIS — Z79899 Other long term (current) drug therapy: Secondary | ICD-10-CM | POA: Diagnosis not present

## 2017-01-23 DIAGNOSIS — F17210 Nicotine dependence, cigarettes, uncomplicated: Secondary | ICD-10-CM | POA: Insufficient documentation

## 2017-01-23 DIAGNOSIS — J111 Influenza due to unidentified influenza virus with other respiratory manifestations: Secondary | ICD-10-CM | POA: Diagnosis not present

## 2017-01-23 DIAGNOSIS — R05 Cough: Secondary | ICD-10-CM | POA: Diagnosis present

## 2017-01-23 MED ORDER — FLUTICASONE PROPIONATE 50 MCG/ACT NA SUSP: 2.0000 | Freq: Every day | NASAL | 0 refills | Status: DC

## 2017-01-23 MED ORDER — BENZONATATE 100 MG PO CAPS: 100.0000 mg | ORAL_CAPSULE | Freq: Three times a day (TID) | ORAL | 0 refills | Status: DC | PRN

## 2017-01-23 NOTE — ED Triage Notes (Signed)
Flu like sx's that began 2 days ago.

## 2017-01-23 NOTE — Discharge Instructions (Signed)
Take the prescription meds as directed. Start OTC Delsym or Robitussin for cough relief. You may also start OTC allergy medicine + pseudoephedrine (Allegra-D, Claritin-D, or Zyrtec-D) for runny nose relief. [facility] for worsening symptoms. You can expect to be sick for about a week. Hydrate to prevent dehydration.

## 2017-01-24 NOTE — ED Provider Notes (Signed)
[facility] Emergency Department Provider Note ____________________________________________  Time seen: 1223  I have reviewed the triage vital signs and the nursing notes.  HISTORY  Chief Complaint  Influenza  HPI Carrie Johnson is a 27 y.o. female resents to the ED for evaluation of 2 day complaint of sudden onset of chills, cough, runny nose, and fevers. Patient reports a Tmax of 102F. She is taken Alka-Seltzer multi-cold symptoms tabs, Tylenol, and cough syrup. She denies receiving a seasonal flu vaccine. She also has reported similar symptoms in her 74-year-old son, who is also here for evaluation.  Past Medical History:  Diagnosis Date  . Anemia   . Chlamydia   . Depression     There are no active problems to display for this patient.   Past Surgical History:  Procedure Laterality Date  . CESAREAN SECTION     x2  . ESOPHAGOGASTRODUODENOSCOPY (EGD) WITH PROPOFOL N/A 01/13/2017   Procedure: ESOPHAGOGASTRODUODENOSCOPY (EGD) WITH PROPOFOL;  Surgeon: Scot Jun, MD;  Location: [facility] ENDOSCOPY;  Service: Endoscopy;  Laterality: N/A;    Prior to Admission medications   Medication Sig Start Date End Date Taking? Authorizing Provider  Acetaminophen 500 MG coapsule Take 1 capsule by mouth as needed for fever.    Historical Provider, MD  benzonatate (TESSALON PERLES) 100 MG capsule Take 1 capsule (100 mg total) by mouth 3 (three) times daily as needed for cough (Take 1-2 per dose). 01/23/17   Adena Sima V Bacon Idella Lamontagne, PA-C  fluticasone (FLONASE) 50 MCG/ACT nasal spray Place 2 sprays into both nostrils daily. 01/23/17   Maribel Hadley V Bacon Sham Alviar, PA-C  naproxen (NAPROSYN) 500 MG tablet Take 1 tablet (500 mg total) by mouth 2 (two) times daily with a meal. Patient not taking: Reported on 01/13/2017 12/28/16 12/28/17  Enid Derry, PA-C  pantoprazole (PROTONIX) 40 MG tablet Take 40 mg by mouth 2 (two) times daily before a meal.    Historical Provider, MD  ranitidine  (ZANTAC) 150 MG tablet Take 1 tablet (150 mg total) by mouth at bedtime. 08/28/16 08/28/17  Sharyn Creamer, MD  sucralfate (CARAFATE) 1 g tablet Take 1 g by mouth 4 (four) times daily.    Historical Provider, MD    Allergies Patient has no known allergies.  No family history on file.  Social History Social History  Substance Use Topics  . Smoking status: Current Every Day Smoker    Packs/day: 0.50    Years: 10.00    Types: Cigarettes  . Smokeless tobacco: Never Used  . Alcohol use 0.6 oz/week    1 Shots of liquor per week    Review of Systems  Constitutional: Positive for fever. Reports chills Eyes: Negative for visual changes. ENT: Negative for sore throat. Reports runny nose. Cardiovascular: Negative for chest pain. Respiratory: Negative for shortness of breath. Reports nonproductive cough. Gastrointestinal: Negative for abdominal pain, vomiting and diarrhea. Genitourinary: Negative for dysuria. Musculoskeletal: Negative for back pain. Skin: Negative for rash. Neurological: Negative for headaches, focal weakness or numbness. ____________________________________________  PHYSICAL EXAM:  VITAL SIGNS: ED Triage Vitals  Enc Vitals Group     BP 01/23/17 1129 133/87     Pulse Rate 01/23/17 1129 100     Resp 01/23/17 1129 18     Temp 01/23/17 1129 (!) 100.7 F (38.2 C)     Temp Source 01/23/17 1129 Oral     SpO2 01/23/17 1129 100 %     Weight 01/23/17 1124 120 lb (54.4 kg)  Height 01/23/17 1124 5\' 2"  (1.575 m)     Head Circumference --      Peak Flow --      Pain Score 01/23/17 1124 0     Pain Loc --      Pain Edu? --      Excl. in GC? --     Constitutional: Alert and oriented. Well appearing and in no distress. Head: Normocephalic and atraumatic. Eyes: Conjunctivae are normal. PERRL. Normal extraocular movements Ears: Canals clear. TMs intact bilaterally. Nose: No congestion/rhinorrhea/epistaxis. Mouth/Throat: Mucous membranes are moist. Uvula is midline and  tonsils are flat. No oropharyngeal erythematous patient. Neck: Supple. No thyromegaly. Hematological/Lymphatic/Immunological: No cervical lymphadenopathy. Cardiovascular: Normal rate, regular rhythm. Normal distal pulses. Respiratory: Normal respiratory effort. No wheezes/rales/rhonchi. Skin:  Skin is warm, dry and intact. No rash noted. ____________________________________________  INITIAL IMPRESSION / ASSESSMENT AND PLAN / ED COURSE  Patient with the clinical presentation likely represents influenza. She is discharged at this time with perceptions [facility] and Flonase. Patient declined to dose Tamiflu for her symptoms. She will increase fluids to prevent dehydration, take over-the-counter cough medicines, and [facility]. Return to the ED as needed. ____________________________________________  FINAL CLINICAL IMPRESSION(S) / ED DIAGNOSES  Final diagnoses:  Influenza      Lissa HoardJenise V Bacon Layn Kye, PA-C 01/24/17 1911    Myrna Blazeravid Matthew Schaevitz, MD 01/25/17 0730

## 2017-02-19 ENCOUNTER — Encounter: Payer: Self-pay | Admitting: Emergency Medicine

## 2017-02-19 ENCOUNTER — Emergency Department
Admission: EM | Admit: 2017-02-19 | Discharge: 2017-02-19 | Disposition: A | Discharge status: Home / Self Care | Payer: Medicaid Other | Attending: Student in an Organized Health Care Education/Training Program | Admitting: Student in an Organized Health Care Education/Training Program

## 2017-02-19 DIAGNOSIS — K644 Residual hemorrhoidal skin tags: Secondary | ICD-10-CM | POA: Insufficient documentation

## 2017-02-19 DIAGNOSIS — Z79899 Other long term (current) drug therapy: Secondary | ICD-10-CM | POA: Insufficient documentation

## 2017-02-19 DIAGNOSIS — F17210 Nicotine dependence, cigarettes, uncomplicated: Secondary | ICD-10-CM | POA: Diagnosis not present

## 2017-02-19 DIAGNOSIS — K6289 Other specified diseases of anus and rectum: Secondary | ICD-10-CM | POA: Diagnosis present

## 2017-02-19 MED ORDER — HYDROCORTISONE ACETATE 25 MG RE SUPP: 25.0000 mg | Freq: Two times a day (BID) | RECTAL | 1 refills | Status: AC

## 2017-02-19 MED ORDER — DOCUSATE SODIUM 100 MG PO CAPS: 100.0000 mg | ORAL_CAPSULE | Freq: Every day | ORAL | 0 refills | Status: AC | PRN

## 2017-02-19 NOTE — ED Triage Notes (Signed)
Pt in with co swollen area between rectum and vagina, states may be an abscess. States has had it for about 4 days, has been increasing in size.

## 2017-02-19 NOTE — ED Provider Notes (Signed)
[facility] Emergency Department Provider Note   ____________________________________________   First MD Initiated Contact with Patient 02/19/17 [phone]     (approximate)  I have reviewed the triage vital signs and the nursing notes.   HISTORY  Chief Complaint Abscess    HPI Carrie Johnson is a 26 y.o. female patient complaining of a swollen area between the rectum and vagina for 4 days. He denies any rectal bleeding with this complaint. Patient denies any pain with this complaint unless she attempts to have a bowel movement. Patient state no bowel movement in 3 days secondary to discomfort. No palliative measures for this complaint.   Past Medical History:  Diagnosis Date  . Anemia   . Chlamydia   . Depression     There are no active problems to display for this patient.   Past Surgical History:  Procedure Laterality Date  . CESAREAN SECTION     x2  . ESOPHAGOGASTRODUODENOSCOPY (EGD) WITH PROPOFOL N/A 01/13/2017   Procedure: ESOPHAGOGASTRODUODENOSCOPY (EGD) WITH PROPOFOL;  Surgeon: Scot Junobert T Elliott, MD;  [facility];  Service: Endoscopy;  Laterality: N/A;    Prior to Admission medications   Medication Sig Start Date End Date Taking? Authorizing Provider  Acetaminophen 500 MG coapsule Take 1 capsule by mouth as needed for fever.    Historical Provider, MD  benzonatate (TESSALON PERLES) 100 MG capsule Take 1 capsule (100 mg total) by mouth 3 (three) times daily as needed for cough (Take 1-2 per dose). 01/23/17   Jenise V Bacon Menshew, PA-C  docusate sodium (COLACE) 100 MG capsule Take 1 capsule (100 mg total) by mouth daily as needed. 02/19/17 02/19/18  Joni Reiningonald K Smith, PA-C  fluticasone (FLONASE) 50 MCG/ACT nasal spray Place 2 sprays into both nostrils daily. 01/23/17   Jenise V Bacon Menshew, PA-C  hydrocortisone (ANUSOL-HC) 25 MG suppository Place 1 suppository (25 mg total) rectally every 12 (twelve) hours. 02/19/17 02/19/18  Joni Reiningonald K Smith,  PA-C  naproxen (NAPROSYN) 500 MG tablet Take 1 tablet (500 mg total) by mouth 2 (two) times daily with a meal. Patient not taking: Reported on 01/13/2017 12/28/16 12/28/17  Enid DerryAshley Wagner, PA-C  pantoprazole (PROTONIX) 40 MG tablet Take 40 mg by mouth 2 (two) times daily before a meal.    Historical Provider, MD  ranitidine (ZANTAC) 150 MG tablet Take 1 tablet (150 mg total) by mouth at bedtime. 08/28/16 08/28/17  Sharyn CreamerMark Quale, MD  sucralfate (CARAFATE) 1 g tablet Take 1 g by mouth 4 (four) times daily.    Historical Provider, MD    Allergies Patient has no known allergies.  No family history on file.  Social History Social History  Substance Use Topics  . Smoking status: Current Every Day Smoker    Packs/day: 0.50    Years: 10.00    Types: Cigarettes  . Smokeless tobacco: Never Used  . Alcohol use 0.6 oz/week    1 Shots of liquor per week    Review of Systems Constitutional: No fever/chills Eyes: No visual changes. ENT: No sore throat. Cardiovascular: Denies chest pain. Respiratory: Denies shortness of breath. Gastrointestinal: No abdominal pain.  No nausea, no vomiting.  No diarrhea.  No constipation. Genitourinary: Negative for dysuria. Musculoskeletal: Negative for back pain. Skin: Negative for rash. Neurological: Negative for headaches, focal weakness or numbness.    ____________________________________________   PHYSICAL EXAM:  VITAL SIGNS: ED Triage Vitals  Enc Vitals Group     BP 02/19/17 0639 128/81     Pulse  Rate 02/19/17 0639 67     Resp 02/19/17 0639 18     Temp 02/19/17 0639 98.8 F (37.1 C)     Temp Source 02/19/17 0639 Oral     SpO2 02/19/17 0641 98 %     Weight 02/19/17 0640 119 lb (54 kg)     Height 02/19/17 0640 5\' 2"  (1.575 m)     Head Circumference --      Peak Flow --      Pain Score 02/19/17 0640 8     Pain Loc --      Pain Edu? --      Excl. in GC? --     Constitutional: Alert and oriented. Well appearing and in no acute distress. Eyes:  Conjunctivae are normal. PERRL. EOMI. Head: Atraumatic. Nose: No congestion/rhinnorhea. Mouth/Throat: Mucous membranes are moist.  Oropharynx non-erythematous. Neck: No stridor.  No cervical spine tenderness to palpation. Hematological/Lymphatic/Immunilogical: No cervical lymphadenopathy. Cardiovascular: Normal rate, regular rhythm. Grossly normal heart sounds.  Good peripheral circulation. Respiratory: Normal respiratory effort.  No retractions. Lungs CTAB. Gastrointestinal: Soft and nontender. No distention. No abdominal bruits. No CVA tenderness.Vascular tissue protruding from the 6 O- clock position of the rectum Musculoskeletal: No lower extremity tenderness nor edema.  No joint effusions. Neurologic:  Normal speech and language. No gross focal neurologic deficits are appreciated. No gait instability. Skin:  Skin is warm, dry and intact. No rash noted. Psychiatric: Mood and affect are normal. Speech and behavior are normal.  ____________________________________________   LABS (all labs ordered are listed, but only abnormal results are displayed)  Labs Reviewed - No data to display ____________________________________________  EKG   ____________________________________________  RADIOLOGY   ____________________________________________   PROCEDURES  Procedure(s) performed: None  Procedures  Critical Care performed: No  ____________________________________________   INITIAL IMPRESSION / ASSESSMENT AND PLAN / ED COURSE  Pertinent labs & imaging results that were available during my care of the patient were reviewed by me and considered in my medical decision making (see chart for details).  Hemorrhoidal tissue protruding from the rectum. Patient given discharge care instructions. Patient given a prescription for Colace and Anusol. Patient advised follow-up with surgical clinic if there is no improvement or worsening complaint in 7-10 days.       ____________________________________________   FINAL CLINICAL IMPRESSION(S) / ED DIAGNOSES  Final diagnoses:  External hemorrhoid      NEW MEDICATIONS STARTED DURING THIS VISIT:  Discharge Medication List as of 02/19/2017  7:22 AM    START taking these medications   Details  docusate sodium (COLACE) 100 MG capsule Take 1 capsule (100 mg total) by mouth daily as needed., Starting Fri 02/19/2017, Until Sat 02/19/2018, Print    hydrocortisone (ANUSOL-HC) 25 MG suppository Place 1 suppository (25 mg total) rectally every 12 (twelve) hours., Starting Fri 02/19/2017, Until Sat 02/19/2018, Print         Note:  This document was prepared using Dragon voice recognition software and may include unintentional dictation errors.    Joni Reining, PA-C 02/19/17 1610    Willy Eddy, MD 02/19/17 [phone]

## 2017-02-19 NOTE — ED Notes (Signed)
Pt states "area" between her vagina and anus for 4 days, denies any drainage or itching, states "I think its herpes", at bedside with PA

## 2017-12-09 ENCOUNTER — Emergency Department
Admission: EM | Admit: 2017-12-09 | Discharge: 2017-12-09 | Disposition: A | Discharge status: Home / Self Care | Payer: Self-pay | Attending: Emergency Medicine | Admitting: Emergency Medicine

## 2017-12-09 ENCOUNTER — Encounter: Payer: Self-pay | Admitting: Intensive Care

## 2017-12-09 DIAGNOSIS — F17210 Nicotine dependence, cigarettes, uncomplicated: Secondary | ICD-10-CM | POA: Insufficient documentation

## 2017-12-09 DIAGNOSIS — Z79899 Other long term (current) drug therapy: Secondary | ICD-10-CM | POA: Insufficient documentation

## 2017-12-09 DIAGNOSIS — N938 Other specified abnormal uterine and vaginal bleeding: Secondary | ICD-10-CM | POA: Insufficient documentation

## 2017-12-09 LAB — CBC WITH DIFFERENTIAL/PLATELET
Basophils Absolute: 0 10*3/uL (ref 0–0.1)
Basophils Relative: 1 %
EOS ABS: 0.2 10*3/uL (ref 0–0.7)
Eosinophils Relative: 4 %
HEMATOCRIT: 40.2 % (ref 35.0–47.0)
HEMOGLOBIN: 13.2 g/dL (ref 12.0–16.0)
LYMPHS ABS: 2.4 10*3/uL (ref 1.0–3.6)
Lymphocytes Relative: 38 %
MCH: 31.3 pg (ref 26.0–34.0)
MCHC: 32.9 g/dL (ref 32.0–36.0)
MCV: 95.3 fL (ref 80.0–100.0)
MONOS PCT: 6 %
Monocytes Absolute: 0.4 10*3/uL (ref 0.2–0.9)
NEUTROS PCT: 51 %
Neutro Abs: 3.3 10*3/uL (ref 1.4–6.5)
Platelets: 260 10*3/uL (ref 150–440)
RBC: 4.22 MIL/uL (ref 3.80–5.20)
RDW: 12.1 % (ref 11.5–14.5)
WBC: 6.4 10*3/uL (ref 3.6–11.0)

## 2017-12-09 LAB — COMPREHENSIVE METABOLIC PANEL
ALK PHOS: 48 U/L (ref 38–126)
ALT: 11 U/L — ABNORMAL LOW (ref 14–54)
ANION GAP: 6 (ref 5–15)
AST: 20 U/L (ref 15–41)
Albumin: 4.5 g/dL (ref 3.5–5.0)
BILIRUBIN TOTAL: 1 mg/dL (ref 0.3–1.2)
BUN: 9 mg/dL (ref 6–20)
CALCIUM: 9.2 mg/dL (ref 8.9–10.3)
CO2: 27 mmol/L (ref 22–32)
Chloride: 104 mmol/L (ref 101–111)
Creatinine, Ser: 0.75 mg/dL (ref 0.44–1.00)
GFR calc non Af Amer: >60 mL/min (ref >60)
Glucose, Bld: 106 mg/dL — ABNORMAL HIGH (ref 65–99)
Potassium: 3.6 mmol/L (ref 3.5–5.1)
SODIUM: 137 mmol/L (ref 135–145)
TOTAL PROTEIN: 7.6 g/dL (ref 6.5–8.1)

## 2017-12-09 LAB — HCG, QUANTITATIVE, PREGNANCY: hCG, Beta Chain, Quant, S: <1 m[IU]/mL (ref <5)

## 2017-12-09 MED ORDER — ESTRADIOL 1 MG PO TABS: 1.0000 mg | ORAL_TABLET | Freq: Every day | ORAL | 0 refills | Status: DC

## 2017-12-09 NOTE — ED Provider Notes (Signed)
[facility] Emergency Department Provider Note   ____________________________________________    I have reviewed the triage vital signs and the nursing notes.   HISTORY  Chief Complaint Vaginal Bleeding     HPI Carrie Johnson is a 26 y.o. female presents with complaints of vaginal bleeding.  Patient reports she has had daily vaginal bleeding over the last 3 months since having a Deposhot.  Prior to this she had been on Depo-Provera without problems.  She denies abdominal pain.  Has seen her PCP for this as well.  Denies dizziness.  Was concerned because she has a history of anemia.  No abdominal pain no pelvic pain   Past Medical History:  Diagnosis Date  . Anemia   . Chlamydia   . Depression     There are no active problems to display for this patient.   Past Surgical History:  Procedure Laterality Date  . CESAREAN SECTION     x2  . ESOPHAGOGASTRODUODENOSCOPY (EGD) WITH PROPOFOL N/A 01/13/2017   Procedure: ESOPHAGOGASTRODUODENOSCOPY (EGD) WITH PROPOFOL;  Surgeon: Scot Junobert T Elliott, MD;  [facility];  Service: Endoscopy;  Laterality: N/A;    Prior to Admission medications   Medication Sig Start Date End Date Taking? Authorizing Provider  Acetaminophen 500 MG coapsule Take 1 capsule by mouth as needed for fever.    [provider]  benzonatate (TESSALON PERLES) 100 MG capsule Take 1 capsule (100 mg total) by mouth 3 (three) times daily as needed for cough (Take 1-2 per dose). 01/23/17   Menshew, Charlesetta IvoryJenise V Bacon, PA-C  docusate sodium (COLACE) 100 MG capsule Take 1 capsule (100 mg total) by mouth daily as needed. 02/19/17 02/19/18  Joni ReiningSmith, Ronald K, PA-C  estradiol (ESTRACE) 1 MG tablet Take 1 tablet (1 mg total) by mouth daily for 14 days. 12/09/17 12/23/17  Jene EveryKinner, Deserea Bordley, MD  fluticasone (FLONASE) 50 MCG/ACT nasal spray Place 2 sprays into both nostrils daily. 01/23/17   Menshew, Charlesetta IvoryJenise V Bacon, PA-C  hydrocortisone (ANUSOL-HC) 25  MG suppository Place 1 suppository (25 mg total) rectally every 12 (twelve) hours. 02/19/17 02/19/18  Joni ReiningSmith, Ronald K, PA-C  naproxen (NAPROSYN) 500 MG tablet Take 1 tablet (500 mg total) by mouth 2 (two) times daily with a meal. Patient not taking: Reported on 01/13/2017 12/28/16 12/28/17  Enid DerryWagner, Ashley, PA-C  pantoprazole (PROTONIX) 40 MG tablet Take 40 mg by mouth 2 (two) times daily before a meal.    [provider]  ranitidine (ZANTAC) 150 MG tablet Take 1 tablet (150 mg total) by mouth at bedtime. 08/28/16 08/28/17  Sharyn CreamerQuale, Mark, MD  sucralfate (CARAFATE) 1 g tablet Take 1 g by mouth 4 (four) times daily.    [provider]     Allergies Patient has no known allergies.  History reviewed. No pertinent family history.  Social History Social History   Tobacco Use  . Smoking status: Current Every Day Smoker    Packs/day: 0.50    Years: 10.00    Pack years: 5.00    Types: Cigarettes  . Smokeless tobacco: Never Used  Substance Use Topics  . Alcohol use: Yes    Alcohol/week: 0.6 oz    Types: 1 Shots of liquor per week  . Drug use: No    Review of Systems  Constitutional: No dizziness Eyes: No visual changes.  ENT: No sore throat. Cardiovascular: Denies chest pain. Respiratory: Denies shortness of breath. Gastrointestinal: No abdominal pain.  No nausea, no vomiting.   Genitourinary: bleeding  as above, no dysuria Musculoskeletal: Negative for back pain. Skin: No pallor Neurological: Negative for headaches   ____________________________________________   PHYSICAL EXAM:  VITAL SIGNS: ED Triage Vitals [12/09/17 1314]  Enc Vitals Group     BP 127/86     Pulse Rate 79     Resp 14     Temp 98.5 F (36.9 C)     Temp Source Oral     SpO2 100 %     Weight 63.5 kg (140 lb)     Height 1.575 m (5\' 2" )     Head Circumference      Peak Flow      Pain Score      Pain Loc      Pain Edu?      Excl. in GC?     Constitutional: Alert and oriented. No acute  distress. Pleasant and interactive Eyes: Conjunctivae are normal.  No pallor  Nose: No congestion/rhinnorhea. Mouth/Throat: Mucous membranes are moist.    Cardiovascular: Normal rate,  Good peripheral circulation. Respiratory: Normal respiratory effort.  No retractions.   Genitourinary: deferred at patient request   Neurologic:  Normal speech and language. No gross focal neurologic deficits are appreciated.  Skin:  Skin is warm, dry and intact. No rash noted. Psychiatric: Mood and affect are normal. Speech and behavior are normal.  ____________________________________________   LABS (all labs ordered are listed, but only abnormal results are displayed)  Labs Reviewed  COMPREHENSIVE METABOLIC PANEL - Abnormal; Notable for the following components:      Result Value   Glucose, Bld 106 (*)    ALT 11 (*)    All other components within normal limits  HCG, QUANTITATIVE, PREGNANCY  CBC WITH DIFFERENTIAL/PLATELET   ____________________________________________  EKG  None ____________________________________________  RADIOLOGY  None ____________________________________________   PROCEDURES  Procedure(s) performed: No  Procedures   Critical Care performed: No ____________________________________________   INITIAL IMPRESSION / ASSESSMENT AND PLAN / ED COURSE  Pertinent labs & imaging results that were available during my care of the patient were reviewed by me and considered in my medical decision making (see chart for details).  Patient well-appearing in no acute distress.  She is reassured that her hemoglobin is normal which is her primary concern for coming today.  She does not want a pelvic exam.  I discussed with Dr. Valentino Saxonherry of OB who recommends estradiol 1 mg x 14 days.    ____________________________________________   FINAL CLINICAL IMPRESSION(S) / ED DIAGNOSES  Final diagnoses:  Dysfunctional uterine bleeding        Note:  This document was  prepared using Dragon voice recognition software and may include unintentional dictation errors.    Jene EveryKinner, Chaunte Hornbeck, MD 12/09/17 [phone]

## 2017-12-09 NOTE — ED Notes (Signed)
Pt to ed with c/o vaginal bleeding non stop for 3 months. Reports she is going through 7 pads in the last 24 hours. Had last depo shot about 3 months ago. due for another last week but did not get it.  States she wants to try something else for birth control.  PMD Phineas Realharles Drew.

## 2017-12-09 NOTE — ED Triage Notes (Signed)
Patient reports she has had vaginal bleeding non stop for 3 months. States going through 7 pads in the last 24 hours. Had last depo shot about 3 months ago.

## 2018-06-09 ENCOUNTER — Encounter: Payer: Self-pay | Admitting: *Deleted

## 2018-06-09 ENCOUNTER — Other Ambulatory Visit: Payer: Self-pay

## 2018-06-15 ENCOUNTER — Encounter
Admission: RE | Disposition: A | Discharge status: Home / Self Care | Payer: Self-pay | Source: Ambulatory Visit | Attending: Otolaryngology

## 2018-06-15 ENCOUNTER — Ambulatory Visit: Payer: Medicaid Other | Admitting: Anesthesiology

## 2018-06-15 ENCOUNTER — Ambulatory Visit
Admission: RE | Admit: 2018-06-15 | Discharge: 2018-06-15 | Disposition: A | Discharge status: Home / Self Care | Payer: Medicaid Other | Source: Ambulatory Visit | Attending: Otolaryngology | Admitting: Otolaryngology

## 2018-06-15 DIAGNOSIS — J039 Acute tonsillitis, unspecified: Secondary | ICD-10-CM | POA: Diagnosis present

## 2018-06-15 DIAGNOSIS — F17200 Nicotine dependence, unspecified, uncomplicated: Secondary | ICD-10-CM | POA: Diagnosis not present

## 2018-06-15 HISTORY — PX: TONSILLECTOMY: SHX5217

## 2018-06-15 SURGERY — TONSILLECTOMY
Anesthesia: General | Site: Throat | Wound class: "Clean Contaminated "

## 2018-06-15 MED ORDER — LACTATED RINGERS IV SOLN
INTRAVENOUS | Status: DC
  Administered 2018-06-15: 08:00:00 via INTRAVENOUS

## 2018-06-15 MED ORDER — ACETAMINOPHEN 10 MG/ML IV SOLN
1000.0000 mg | Freq: Once | INTRAVENOUS | Status: AC
  Administered 2018-06-15: 1000 mg via INTRAVENOUS

## 2018-06-15 MED ORDER — BUPIVACAINE HCL (PF) 0.25 % IJ SOLN
INTRAMUSCULAR | Status: DC | PRN
  Administered 2018-06-15: 2 mL

## 2018-06-15 MED ORDER — FENTANYL CITRATE (PF) 100 MCG/2ML IJ SOLN
INTRAMUSCULAR | Status: DC | PRN
  Administered 2018-06-15: 50 ug via INTRAVENOUS
  Administered 2018-06-15: 100 ug via INTRAVENOUS

## 2018-06-15 MED ORDER — GLYCOPYRROLATE 0.2 MG/ML IJ SOLN
INTRAMUSCULAR | Status: DC | PRN
  Administered 2018-06-15: .1 mg via INTRAVENOUS

## 2018-06-15 MED ORDER — AMOXICILLIN 500 MG PO CAPS: 500.0000 mg | ORAL_CAPSULE | Freq: Three times a day (TID) | ORAL | 0 refills | Status: AC

## 2018-06-15 MED ORDER — MIDAZOLAM HCL 5 MG/5ML IJ SOLN
INTRAMUSCULAR | Status: DC | PRN
  Administered 2018-06-15: 2 mg via INTRAVENOUS

## 2018-06-15 MED ORDER — FENTANYL CITRATE (PF) 100 MCG/2ML IJ SOLN: 25.0000 ug | INTRAMUSCULAR | Status: DC | PRN

## 2018-06-15 MED ORDER — OXYCODONE HCL 5 MG PO TABS: 5.0000 mg | ORAL_TABLET | Freq: Once | ORAL | Status: AC | PRN

## 2018-06-15 MED ORDER — DEXAMETHASONE SODIUM PHOSPHATE 4 MG/ML IJ SOLN
INTRAMUSCULAR | Status: DC | PRN
  Administered 2018-06-15: 10 mg via INTRAVENOUS

## 2018-06-15 MED ORDER — SCOPOLAMINE 1 MG/3DAYS TD PT72
1.0000 | MEDICATED_PATCH | Freq: Once | TRANSDERMAL | Status: DC
  Administered 2018-06-15: 1.5 mg via TRANSDERMAL

## 2018-06-15 MED ORDER — ONDANSETRON HCL 4 MG/2ML IJ SOLN
INTRAMUSCULAR | Status: DC | PRN
  Administered 2018-06-15: 4 mg via INTRAVENOUS

## 2018-06-15 MED ORDER — OXYCODONE HCL 5 MG/5ML PO SOLN: 10.0000 mg | Freq: Four times a day (QID) | ORAL | 0 refills | Status: AC | PRN

## 2018-06-15 MED ORDER — ACETAMINOPHEN 325 MG PO TABS: 325.0000 mg | ORAL_TABLET | ORAL | Status: DC | PRN

## 2018-06-15 MED ORDER — PROPOFOL 10 MG/ML IV BOLUS
INTRAVENOUS | Status: DC | PRN
  Administered 2018-06-15: 150 mg via INTRAVENOUS
  Administered 2018-06-15: 30 mg via INTRAVENOUS

## 2018-06-15 MED ORDER — OXYCODONE HCL 5 MG/5ML PO SOLN
5.0000 mg | Freq: Once | ORAL | Status: AC | PRN
  Administered 2018-06-15: 5 mg via ORAL

## 2018-06-15 MED ORDER — LIDOCAINE HCL (CARDIAC) PF 100 MG/5ML IV SOSY
PREFILLED_SYRINGE | INTRAVENOUS | Status: DC | PRN
  Administered 2018-06-15: 50 mg via INTRAVENOUS

## 2018-06-15 MED ORDER — SUCCINYLCHOLINE CHLORIDE 20 MG/ML IJ SOLN
INTRAMUSCULAR | Status: DC | PRN
  Administered 2018-06-15: 80 mg via INTRAVENOUS

## 2018-06-15 MED ORDER — ACETAMINOPHEN 160 MG/5ML PO SOLN: 325.0000 mg | ORAL | Status: DC | PRN

## 2018-06-15 MED ORDER — OXYMETAZOLINE HCL 0.05 % NA SOLN
NASAL | Status: DC | PRN
  Administered 2018-06-15: 1 via TOPICAL

## 2018-06-15 MED ORDER — ONDANSETRON HCL 4 MG PO TABS: 4.0000 mg | ORAL_TABLET | Freq: Three times a day (TID) | ORAL | 0 refills | Status: AC | PRN

## 2018-06-15 MED ORDER — LIDOCAINE VISCOUS HCL 2 % MT SOLN: 10.0000 mL | Freq: Four times a day (QID) | OROMUCOSAL | 0 refills | Status: AC | PRN

## 2018-06-15 MED ORDER — PROMETHAZINE HCL 25 MG/ML IJ SOLN: 6.2500 mg | INTRAMUSCULAR | Status: DC | PRN

## 2018-06-15 SURGICAL SUPPLY — 20 items
BLADE BOVIE TIP EXT 4 (BLADE) ×4 IMPLANT
CANISTER SUCT 1200ML W/VALVE (MISCELLANEOUS) ×4 IMPLANT
CATH ROBINSON RED A/P 10FR (CATHETERS) ×4 IMPLANT
COAG SUCT 10F 3.5MM HAND CTRL (MISCELLANEOUS) ×4 IMPLANT
ELECT REM PT RETURN 9FT ADLT (ELECTROSURGICAL) ×4
ELECTRODE REM PT RTRN 9FT ADLT (ELECTROSURGICAL) ×2 IMPLANT
GLOVE BIO SURGEON STRL SZ7.5 (GLOVE) ×7 IMPLANT
HANDLE SUCTION POOLE (INSTRUMENTS) ×1 IMPLANT
KIT TURNOVER KIT A (KITS) ×4 IMPLANT
NDL HYPO 25GX1X1/2 BEV (NEEDLE) ×1 IMPLANT
NEEDLE HYPO 25GX1X1/2 BEV (NEEDLE) ×4 IMPLANT
NS IRRIG 500ML POUR BTL (IV SOLUTION) ×4 IMPLANT
PACK TONSIL/ADENOIDS (PACKS) ×4 IMPLANT
PENCIL SMOKE EVACUATOR (MISCELLANEOUS) ×4 IMPLANT
SLEEVE SUCTION 125 (MISCELLANEOUS) ×4 IMPLANT
SOL ANTI-FOG 6CC FOG-OUT (MISCELLANEOUS) ×2 IMPLANT
SOL FOG-OUT ANTI-FOG 6CC (MISCELLANEOUS) ×2
STRAP BODY AND KNEE 60X3 (MISCELLANEOUS) ×4 IMPLANT
SUCTION POOLE HANDLE (INSTRUMENTS)
SYR 5ML LL (SYRINGE) ×4 IMPLANT

## 2018-06-15 NOTE — Op Note (Signed)
..  06/15/2018  9:43 AM    Carrie Johnson, Carrie Johnson  [id number]   Pre-Op Dx:  TONSILLITIS  Post-op Dx: TONSILLITIS  Proc:Tonsillectomy and Adenoidectomy > age 27  Surg: Maralyn Witherell  Anes:  General Endotracheal  EBL:  20ml  Comp:  None  Findings:  3+ cryptic and erythematous tonsils. Left tonsil with acute inflammation and prominent mid tonsillar arterial blood supply.  Meticulous hemostasis was achieved.  Procedure: After the patient was identified in holding and the history and physical and consent was reviewed, the patient was taken to the operating room and placed in a supine position.  General endotracheal anesthesia was induced in the normal fashion.  At this time, the patient was rotated 45 degrees and a shoulder roll was placed.  At this time, a McIvor mouthgag was inserted into the patient's oral cavity and suspended from the Mayo stand without injury to teeth, lips, or gums.  Next a red rubber catheter was inserted into the patient left nostril for retraction of the uvula and soft palate superiorly.  Next a curved Alice clamp was attached to the patient's right superior tonsillar pole and retracted medially and inferiorly.  A Bovie electrocautery was used to dissect the patient's right tonsil in a subcapsular plane.  Meticulous hemostasis was achieved with Bovie suction cautery.  At this time, the mouth gag was released from suspension for 1 minute.  Attention now was directed to the patient's left side.  In a similar fashion the curved Alice clamp was attached to the superior pole and this was retracted medially and inferiorly and the tonsil was excised in a subcapsular plane with Bovie electrocautery.  After completion of the second tonsil, meticulous hemostasis was continued.  At this time, attention was directed to the patient's Adenoids.  Under indirect visualization using an operating mirror, the adenoid tissue was visualized and noted to be absent in nature so no adenoidectomy  was performed.     Meticulous hemostasis was continued.  At this time, the patient's nasal cavity and oral cavity was irrigated with sterile saline.  2ml of 0.25% Marcaine was injected into the anterior and posterior tonsillar fossa bilaterally.  Following this  The care of patient was returned to anesthesia, awakened, and transferred to recovery in stable condition.  Dispo:  PACU to home  Plan: Soft diet.  Limit exercise and strenuous activity for 2 weeks.  Fluid hydration  Recheck my office three weeks.   Tres Grzywacz 9:43 AM 06/15/2018

## 2018-06-15 NOTE — Anesthesia Procedure Notes (Signed)
Procedure Name: Intubation Date/Time: 06/15/2018 9:13 AM Performed by: Jimmy PicketAmyot, Dunya Meiners, CRNA Pre-anesthesia Checklist: Patient identified, Emergency Drugs available, Suction available, Patient being monitored and Timeout performed Patient Re-evaluated:Patient Re-evaluated prior to induction Oxygen Delivery Method: Circle system utilized Preoxygenation: Pre-oxygenation with 100% oxygen Induction Type: IV induction Ventilation: Mask ventilation without difficulty Laryngoscope Size: Miller and 2 Grade View: Grade I Tube type: Oral Rae Tube size: 7.0 mm Number of attempts: 1 Placement Confirmation: ETT inserted through vocal cords under direct vision,  positive ETCO2 and breath sounds checked- equal and bilateral Tube secured with: Tape Dental Injury: Teeth and Oropharynx as per pre-operative assessment

## 2018-06-15 NOTE — Anesthesia Postprocedure Evaluation (Signed)
Anesthesia Post Note  Patient: Carrie SpatzShikira Gibbs Johnson  Procedure(s) Performed: TONSILLECTOMY (N/A Throat)  Patient location during evaluation: PACU Anesthesia Type: General Level of consciousness: awake Pain management: pain level controlled Vital Signs Assessment: post-procedure vital signs reviewed and stable Respiratory status: respiratory function stable Cardiovascular status: stable Postop Assessment: no signs of nausea or vomiting Anesthetic complications: no    Jola BabinskiElsje Mason Dibiasio

## 2018-06-15 NOTE — H&P (Signed)
..  History and Physical paper copy reviewed and updated date of procedure and will be scanned into system.  Patient seen and examined.  Recent congestion nasally and drainage but normal appearing tonsils and good breath sounds on exam.

## 2018-06-15 NOTE — Anesthesia Preprocedure Evaluation (Signed)
Anesthesia Evaluation  Patient identified by MRN, date of birth, ID band Patient awake    Reviewed: Allergy & Precautions, NPO status , Patient's Chart, lab work & pertinent test results  Airway Mallampati: II  TM Distance: >3 FB     Dental   Pulmonary Current Smoker,    breath sounds clear to auscultation       Cardiovascular Exercise Tolerance: Good negative cardio ROS   Rhythm:Regular Rate:Normal     Neuro/Psych Depression    GI/Hepatic negative GI ROS, neg GERD  ,  Endo/Other  negative endocrine ROS  Renal/GU      Musculoskeletal   Abdominal   Peds  Hematology  (+) anemia ,   Anesthesia Other Findings   Reproductive/Obstetrics                             Anesthesia Physical Anesthesia Plan  ASA: II  Anesthesia Plan: General   Post-op Pain Management:    Induction: Intravenous  PONV Risk Score and Plan: 2 and Ondansetron, Dexamethasone and Midazolam  Airway Management Planned: Oral ETT  Additional Equipment:   Intra-op Plan:   Post-operative Plan:   Informed Consent: I have reviewed the patients History and Physical, chart, labs and discussed the procedure including the risks, benefits and alternatives for the proposed anesthesia with the patient or authorized representative who has indicated his/her understanding and acceptance.   Dental advisory given  Plan Discussed with: CRNA  Anesthesia Plan Comments:         Anesthesia Quick Evaluation

## 2018-06-15 NOTE — Discharge Instructions (Signed)
T & A INSTRUCTION SHEET - [facility] °[facility] ° °CREIGHTON VAUGHT, MD °PAUL H. JUENGEL, MD  °P. SCOTT BENNETT °CHAPMAN MCQUEEN, MD ° °[address] [place], [place] [address] TEL. [phone] °[address] [place] [address] [phone] ° °INFORMATION SHEET FOR A TONSILLECTOMY AND ADENDOIDECTOMY ° °About Your Tonsils and Adenoids ° The tonsils and adenoids are normal body tissues that are part of our immune system.  They normally help to protect us against diseases that may enter our mouth and nose.  However, sometimes the tonsils and/or adenoids become too large and obstruct our breathing, especially at night. °  ° If either of these things happen it helps to remove the tonsils and adenoids in order to become healthier. The operation to remove the tonsils and adenoids is called a tonsillectomy and adenoidectomy. ° °The Location of Your Tonsils and Adenoids ° The tonsils are located in the back of the throat on both side and sit in a cradle of muscles. The adenoids are located in the roof of the mouth, behind the nose, and closely associated with the opening of the Eustachian tube to the ear. ° °Surgery on Tonsils and Adenoids ° A tonsillectomy and adenoidectomy is a short operation which takes about thirty minutes.  This includes being put to sleep and being awakened.  Tonsillectomies and adenoidectomies are performed at [facility] and may require observation period in the recovery room prior to going home. ° °Following the Operation for a Tonsillectomy ° A cautery machine is used to control bleeding.  Bleeding from a tonsillectomy and adenoidectomy is minimal and postoperatively the risk of bleeding is approximately four percent, although this rarely life threatening. ° ° ° °After your tonsillectomy and adenoidectomy post-op care at home: ° °1. Our patients are able to go home the same day.  You may be given prescriptions for pain  medications and antibiotics, if indicated. °2. It is extremely important to remember that fluid intake is of utmost importance after a tonsillectomy.  The amount that you drink must be maintained in the postoperative period.  A good indication of whether a child is getting enough fluid is whether his/her urine output is constant.  As long as children are urinating or wetting their diaper every 6 - 8 hours this is usually enough fluid intake.   °3. Although rare, this is a risk of some bleeding in the first ten days after surgery.  This is usually occurs between day five and nine postoperatively.  This risk of bleeding is approximately four percent.  If you or your child should have any bleeding you should remain calm and notify our office or go directly to the Emergency Room at [facility] where they will contact us. Our doctors are available seven days a week for notification.  We recommend sitting up quietly in a chair, place an ice pack on the front of the neck and spitting out the blood gently until we are able to contact you.  Adults should gargle gently with ice water and this may help stop the bleeding.  If the bleeding does not stop after a short time, i.e. 10 to 15 minutes, or seems to be increasing again, please contact us or go to the hospital.   °4. It is common for the pain to be worse at 5 - 7 days postoperatively.  This occurs because the “scab” is peeling off and the mucous membrane (skin of   the throat) is growing back where the tonsils were.   °5. It is common for a low-grade fever, less than 102, during the first week after a tonsillectomy and adenoidectomy.  It is usually due to not drinking enough liquids, and we suggest your use liquid Tylenol or the pain medicine with Tylenol prescribed in order to keep your temperature below 102.  Please follow the directions on the back of the bottle. °6. Do not take aspirin or any products that contain aspirin such as Bufferin, Anacin,  Ecotrin, aspirin gum, Goodies, BC headache powders, etc., after a T&A because it can promote bleeding.  Please check with our office before administering any other medication that may been prescribed by other doctors during the two week post-operative period. °7. If you happen to look in the mirror or into your child’s mouth you will see white/gray patches on the back of the throat.  This is what a scab looks like in the mouth and is normal after having a T&A.  It will disappear once the tonsil area heals completely. However, it may cause a noticeable odor, and this too will disappear with time.     °8. You or your child may experience ear pain after having a T&A.  This is called referred pain and comes from the throat, but it is felt in the ears.  Ear pain is quite common and expected.  It will usually go away after ten days.  There is usually nothing wrong with the ears, and it is primarily due to the healing area stimulating the nerve to the ear that runs along the side of the throat.  Use either the prescribed pain medicine or Tylenol as needed.  °9. The throat tissues after a tonsillectomy are obviously sensitive.  Smoking around children who have had a tonsillectomy significantly increases the risk of bleeding.  DO NOT SMOKE!  ° °Scopolamine skin patches °REMOVE PATCH IN 72 HOURS AND WASH HANDS IMMEDIATELY °What is this medicine? °SCOPOLAMINE (skoe POL a meen) is used to prevent nausea and vomiting caused by motion sickness, anesthesia and surgery. °This medicine may be used for other purposes; ask your health care provider or pharmacist if you have questions. °COMMON BRAND NAME(S): Transderm Scop °What should I tell my health care provider before I take this medicine? °They need to know if you have any of these conditions: °-glaucoma °-kidney or liver disease °-an unusual or allergic reaction (especially skin allergy) to scopolamine, atropine, other medicines, foods, dyes, or preservatives °-pregnant or  trying to get pregnant °-breast-feeding °How should I use this medicine? °This medicine is for external use only. Follow the directions on the prescription label. One patch contains enough medicine to prevent motion sickness for up to 3 days. Apply the patch at least 4 hours before you need it and only wear one disc at a time. Choose an area behind the ear, that is clean, dry, hairless and free from any cuts or irritation. Wipe the area with a clean dry tissue. Peel off the plastic backing of the skin patch, trying not to touch the adhesive side with your hands. Do not cut the patches. Firmly apply to the area you have chosen, with the metallic side of the patch to the skin and the tan-colored side showing. Once firmly in place, wash your hands well with soap and water. Remove the disc after 3 days, or sooner if you no longer need it. After removing the patch, wash your hands and the area behind   your ear thoroughly with soap and water. The patch will still contain some medicine after use. To avoid accidental contact or ingestion by children or pets, fold the used patch in half with the sticky side together and throw away in the trash out of the reach of children and pets. If you need to use a second patch after you remove the first, place it behind the other ear. °Talk to your pediatrician regarding the use of this medicine in children. Special care may be needed. °Overdosage: If you think you have taken too much of this medicine contact a poison control center or emergency room at once. °NOTE: This medicine is only for you. Do not share this medicine with others. °What if I miss a dose? °Make sure you apply the patch at least 4 hours before you need it. You can apply it the night before traveling. °What may interact with this medicine? °-benztropine °-bethanechol °-medicines for anxiety or sleeping problems like diazepam or temazepam °-medicines for hay fever and other allergies °-medicines for mental  depression °-muscle relaxants °This list may not describe all possible interactions. Give your health care provider a list of all the medicines, herbs, non-prescription drugs, or dietary supplements you use. Also tell them if you smoke, drink alcohol, or use illegal drugs. Some items may interact with your medicine. °What should I watch for while using this medicine? °Keep the patch dry, if possible, to prevent it from falling off. Limited contact with water, however, as in bathing or swimming, will not affect the system. If the patch falls off, throw it away and put a new one behind the other ear. °You may get drowsy or dizzy. Do not drive, use machinery, or do anything that needs mental alertness until you know how this medicine affects you. Do not stand or sit up quickly, especially if you are an older patient. This reduces the risk of dizzy or fainting spells. Alcohol may interfere with the effect of this medicine. Avoid alcoholic drinks. °Your mouth may get dry. Chewing sugarless gum or sucking hard candy, and drinking plenty of water may help. Contact your doctor if the problem does not go away or is severe. °This medicine may cause dry eyes and blurred vision. If you wear contact lenses you may feel some discomfort. Lubricating drops may help. See your eye doctor if the problem does not go away or is severe. °If you are going to have a magnetic resonance imaging (MRI) procedure, tell your MRI technician if you have this patch on your body. It must be removed before a MRI. °What side effects may I notice from receiving this medicine? °Side effects that you should report to your doctor or health care professional as soon as possible: °-agitation, nervousness, confusion °-blurred vision and other eye problems °-dizziness, drowsiness °-eye pain or redness in the whites of the eye °-hallucinations °-pain or difficulty passing urine °-skin rash, itching °-vomiting °Side effects that usually do not require medical  attention (report to your doctor or health care professional if they continue or are bothersome): °-headache °-nausea °This list may not describe all possible side effects. Call your doctor for medical advice about side effects. You may report side effects to FDA at 1-800-FDA-1088. °Where should I keep my medicine? °Keep out of the reach of children. °Store at room temperature between 20 and 25 degrees C (68 and 77 degrees F). Throw away any unused medicine after the expiration date. When you remove a patch, fold it and throw   it in the trash as described above. °NOTE: This sheet is a summary. It may not cover all possible information. If you have questions about this medicine, talk to your doctor, pharmacist, or health care provider. °© 2018 Elsevier/Gold Standard (2012-04-28 13:31:48) ° ° °General Anesthesia, Adult, Care After °These instructions provide you with information about caring for yourself after your procedure. Your health care provider may also give you more specific instructions. Your treatment has been planned according to current medical practices, but problems sometimes occur. Call your health care provider if you have any problems or questions after your procedure. °What can I expect after the procedure? °After the procedure, it is common to have: °· Vomiting. °· A sore throat. °· Mental slowness. ° °It is common to feel: °· Nauseous. °· Cold or shivery. °· Sleepy. °· Tired. °· Sore or achy, even in parts of your body where you did not have surgery. ° °Follow these instructions at home: °For at least 24 hours after the procedure: °· Do not: °? Participate in activities where you could fall or become injured. °? Drive. °? Use heavy machinery. °? Drink alcohol. °? Take sleeping pills or medicines that cause drowsiness. °? Make important decisions or sign legal documents. °? Take care of children on your own. °· Rest. °Eating and drinking °· If you vomit, drink water, juice, or soup when you can drink  without vomiting. °· Drink enough fluid to keep your urine clear or pale yellow. °· Make sure you have little or no nausea before eating solid foods. °· Follow the diet recommended by your health care provider. °General instructions °· Have a responsible adult stay with you until you are awake and alert. °· Return to your normal activities as told by your health care provider. Ask your health care provider what activities are safe for you. °· Take over-the-counter and prescription medicines only as told by your health care provider. °· If you smoke, do not smoke without supervision. °· Keep all follow-up visits as told by your health care provider. This is important. °Contact a health care provider if: °· You continue to have nausea or vomiting at home, and medicines are not helpful. °· You cannot drink fluids or start eating again. °· You cannot urinate after 8-12 hours. °· You develop a skin rash. °· You have fever. °· You have increasing redness at the site of your procedure. °Get help right away if: °· You have difficulty breathing. °· You have chest pain. °· You have unexpected bleeding. °· You feel that you are having a life-threatening or urgent problem. °This information is not intended to replace advice given to you by your health care provider. Make sure you discuss any questions you have with your health care provider. °Document Released: 03/08/2001 Document Revised: 05/04/2016 Document Reviewed: 11/14/2015 °Elsevier Interactive Patient Education © 2018 Elsevier Inc. ° °

## 2018-06-15 NOTE — Transfer of Care (Signed)
Immediate Anesthesia Transfer of Care Note  Patient: Carrie Johnson  Procedure(s) Performed: TONSILLECTOMY (N/A Throat)  Patient Location: PACU  Anesthesia Type: General  Level of Consciousness: awake, alert  and patient cooperative  Airway and Oxygen Therapy: Patient Spontanous Breathing and Patient connected to supplemental oxygen  Post-op Assessment: Post-op Vital signs reviewed, Patient's Cardiovascular Status Stable, Respiratory Function Stable, Patent Airway and No signs of Nausea or vomiting  Post-op Vital Signs: Reviewed and stable  Complications: No apparent anesthesia complications

## 2018-06-20 LAB — SURGICAL PATHOLOGY

## 2018-07-18 ENCOUNTER — Encounter: Payer: Self-pay | Admitting: Emergency Medicine

## 2018-07-18 ENCOUNTER — Emergency Department
Admission: EM | Admit: 2018-07-18 | Discharge: 2018-07-18 | Disposition: A | Discharge status: Home / Self Care | Payer: Medicaid Other | Attending: Emergency Medicine | Admitting: Emergency Medicine

## 2018-07-18 DIAGNOSIS — K279 Peptic ulcer, site unspecified, unspecified as acute or chronic, without hemorrhage or perforation: Secondary | ICD-10-CM

## 2018-07-18 DIAGNOSIS — F17210 Nicotine dependence, cigarettes, uncomplicated: Secondary | ICD-10-CM | POA: Insufficient documentation

## 2018-07-18 DIAGNOSIS — R109 Unspecified abdominal pain: Secondary | ICD-10-CM | POA: Diagnosis present

## 2018-07-18 LAB — COMPREHENSIVE METABOLIC PANEL
ALT: 10 U/L (ref 0–44)
ANION GAP: 6 (ref 5–15)
AST: 16 U/L (ref 15–41)
Albumin: 4.4 g/dL (ref 3.5–5.0)
Alkaline Phosphatase: 44 U/L (ref 38–126)
BUN: 5 mg/dL — ABNORMAL LOW (ref 6–20)
CHLORIDE: 105 mmol/L (ref 98–111)
CO2: 30 mmol/L (ref 22–32)
Calcium: 9.3 mg/dL (ref 8.9–10.3)
Creatinine, Ser: 0.6 mg/dL (ref 0.44–1.00)
Glucose, Bld: 112 mg/dL — ABNORMAL HIGH (ref 70–99)
POTASSIUM: 3 mmol/L — AB (ref 3.5–5.1)
SODIUM: 141 mmol/L (ref 135–145)
Total Bilirubin: 1 mg/dL (ref 0.3–1.2)
Total Protein: 7.2 g/dL (ref 6.5–8.1)

## 2018-07-18 LAB — URINALYSIS, COMPLETE (UACMP) WITH MICROSCOPIC
BACTERIA UA: NONE SEEN
BILIRUBIN URINE: NEGATIVE
Glucose, UA: NEGATIVE mg/dL
KETONES UR: NEGATIVE mg/dL
LEUKOCYTES UA: NEGATIVE
Nitrite: NEGATIVE
PH: 5 (ref 5.0–8.0)
Protein, ur: 30 mg/dL — AB
Specific Gravity, Urine: 1.026 (ref 1.005–1.030)

## 2018-07-18 LAB — CBC
HEMATOCRIT: 34.4 % — AB (ref 35.0–47.0)
HEMOGLOBIN: 11.8 g/dL — AB (ref 12.0–16.0)
MCH: 32.1 pg (ref 26.0–34.0)
MCHC: 34.3 g/dL (ref 32.0–36.0)
MCV: 93.4 fL (ref 80.0–100.0)
Platelets: 278 10*3/uL (ref 150–440)
RBC: 3.68 MIL/uL — AB (ref 3.80–5.20)
RDW: 12.6 % (ref 11.5–14.5)
WBC: 7.4 10*3/uL (ref 3.6–11.0)

## 2018-07-18 LAB — LIPASE, BLOOD: Lipase: 28 U/L (ref 11–51)

## 2018-07-18 LAB — POCT PREGNANCY, URINE: Preg Test, Ur: NEGATIVE

## 2018-07-18 MED ORDER — GI COCKTAIL ~~LOC~~
30.0000 mL | Freq: Once | ORAL | Status: AC
  Administered 2018-07-18: 30 mL via ORAL
  Filled 2018-07-18: qty 30

## 2018-07-18 MED ORDER — OXYCODONE-ACETAMINOPHEN 5-325 MG PO TABS: 1.0000 | ORAL_TABLET | Freq: Three times a day (TID) | ORAL | 0 refills | Status: AC | PRN

## 2018-07-18 MED ORDER — FAMOTIDINE 20 MG PO TABS: 20.0000 mg | ORAL_TABLET | Freq: Two times a day (BID) | ORAL | 1 refills | Status: AC

## 2018-07-18 MED ORDER — FAMOTIDINE 20 MG PO TABS
20.0000 mg | ORAL_TABLET | Freq: Once | ORAL | Status: AC
  Administered 2018-07-18: 20 mg via ORAL
  Filled 2018-07-18: qty 1

## 2018-07-18 MED ORDER — SUCRALFATE 1 G PO TABS
1.0000 g | ORAL_TABLET | Freq: Once | ORAL | Status: AC
  Administered 2018-07-18: 1 g via ORAL
  Filled 2018-07-18: qty 1

## 2018-07-18 MED ORDER — SUCRALFATE 1 G PO TABS: 1.0000 g | ORAL_TABLET | Freq: Four times a day (QID) | ORAL | 1 refills | Status: AC

## 2018-07-18 NOTE — ED Provider Notes (Signed)
[facility] Emergency Department Provider Note       Time seen: ----------------------------------------- 8:12 PM on 07/18/2018 -----------------------------------------   I have reviewed the triage vital signs and the nursing notes.  HISTORY   Chief Complaint Abdominal Pain    HPI Carrie Johnson is a 27 y.o. female with a history of anemia and depression who presents to the ED for abdominal pain that wraps around her lower back for the last 3 days.  Patient states she has a history of ulcers in the past and the pain feels similarly.  She tried over-the-counter medications at home without any relief.  Symptoms seem to worsen after she drank alcohol this weekend.  Past Medical History:  Diagnosis Date  . Anemia   . Chlamydia   . Depression     There are no active problems to display for this patient.   Past Surgical History:  Procedure Laterality Date  . CESAREAN SECTION     x2  . ESOPHAGOGASTRODUODENOSCOPY (EGD) WITH PROPOFOL N/A 01/13/2017   Procedure: ESOPHAGOGASTRODUODENOSCOPY (EGD) WITH PROPOFOL;  Surgeon: Scot Junobert T Elliott, MD;  [facility];  Service: Endoscopy;  Laterality: N/A;  . TONSILLECTOMY N/A 06/15/2018   Procedure: TONSILLECTOMY;  Surgeon: Bud FaceVaught, Creighton, MD;  [facility];  Service: ENT;  Laterality: N/A;    Allergies Patient has no known allergies.  Social History Social History   Tobacco Use  . Smoking status: Current Every Day Smoker    Packs/day: 0.50    Years: 10.00    Pack years: 5.00    Types: Cigarettes  . Smokeless tobacco: Never Used  Substance Use Topics  . Alcohol use: Yes    Alcohol/week: 0.6 oz    Types: 1 Shots of liquor per week  . Drug use: No   Review of Systems Constitutional: Negative for fever. Cardiovascular: Negative for chest pain. Respiratory: Negative for shortness of breath. Gastrointestinal: Positive for abdominal pain Musculoskeletal: Negative for back  pain. Skin: Negative for rash. Neurological: Negative for headaches, focal weakness or numbness.  All systems negative/normal/unremarkable except as stated in the HPI  ____________________________________________   PHYSICAL EXAM:  VITAL SIGNS: ED Triage Vitals  Enc Vitals Group     BP 07/18/18 1933 134/78     Pulse Rate 07/18/18 1933 80     Resp 07/18/18 1933 17     Temp 07/18/18 1933 97.7 F (36.5 C)     Temp Source 07/18/18 1933 Oral     SpO2 07/18/18 1933 100 %     Weight --      Height --      Head Circumference --      Peak Flow --      Pain Score 07/18/18 1934 8     Pain Loc --      Pain Edu? --      Excl. in GC? --    Constitutional: Alert and oriented. Well appearing and in no distress. Eyes: Conjunctivae are normal. Normal extraocular movements. Cardiovascular: Normal rate, regular rhythm. No murmurs, rubs, or gallops. Respiratory: Normal respiratory effort without tachypnea nor retractions. Breath sounds are clear and equal bilaterally. No wheezes/rales/rhonchi. Gastrointestinal: Soft and nontender. Normal bowel sounds Musculoskeletal: Nontender with normal range of motion in extremities. No lower extremity tenderness nor edema. Neurologic:  Normal speech and language. No gross focal neurologic deficits are appreciated.  Skin:  Skin is warm, dry and intact. No rash noted. Psychiatric: Mood and affect are normal. Speech and behavior are normal.  ___________________________________________  ED COURSE:  As part of my medical decision making, I reviewed the following data within the electronic MEDICAL RECORD NUMBER History obtained from family if available, nursing notes, old chart and ekg, as well as notes from prior ED visits. Patient presented for pain likely secondary to gastritis or peptic ulcer disease, we will assess with labs as indicated at this time.  Patient will receive GI cocktail as well as Pepcid, Carafate    Procedures ____________________________________________   LABS (pertinent positives/negatives)  Labs Reviewed  COMPREHENSIVE METABOLIC PANEL - Abnormal; Notable for the following components:      Result Value   Potassium 3.0 (*)    Glucose, Bld 112 (*)    BUN 5 (*)    All other components within normal limits  CBC - Abnormal; Notable for the following components:   RBC 3.68 (*)    Hemoglobin 11.8 (*)    HCT 34.4 (*)    All other components within normal limits  URINALYSIS, COMPLETE (UACMP) WITH MICROSCOPIC - Abnormal; Notable for the following components:   Color, Urine YELLOW (*)    APPearance HAZY (*)    Hgb urine dipstick SMALL (*)    Protein, ur 30 (*)    All other components within normal limits  LIPASE, BLOOD  POC URINE PREG, ED  POCT PREGNANCY, URINE  ____________________________________________  DIFFERENTIAL DIAGNOSIS   Gastritis, peptic ulcer disease, GERD, pregnancy  FINAL ASSESSMENT AND PLAN  Peptic ulcer disease   Plan: The patient had presented for upper abdominal pain. Patient's labs are reassuring.  She was given a GI cocktail as well as Pepcid and Carafate.  We will prescribe antacids and pain medicine and refer her back to GI for follow-up.   Ulice Dash, MD   Note: This note was generated in part or whole with voice recognition software. Voice recognition is usually quite accurate but there are transcription errors that can and very often do occur. I apologize for any typographical errors that were not detected and corrected.     Emily Filbert, MD 07/18/18 2121

## 2018-07-18 NOTE — ED Notes (Signed)

## 2018-07-18 NOTE — ED Triage Notes (Signed)
Pt c/o lower abdominal pain that "wraps" around to pts lower back, x3 days. Pt reports she has had ulcer sin the past and pain feels similar. Pt has tried OTC medications at home without relief.

## 2018-11-02 ENCOUNTER — Encounter: Payer: Self-pay | Admitting: Emergency Medicine

## 2018-11-02 ENCOUNTER — Other Ambulatory Visit: Payer: Self-pay

## 2018-11-02 ENCOUNTER — Emergency Department: Payer: Medicaid Other

## 2018-11-02 ENCOUNTER — Emergency Department
Admission: EM | Admit: 2018-11-02 | Discharge: 2018-11-02 | Disposition: A | Discharge status: Home / Self Care | Payer: Medicaid Other | Attending: Emergency Medicine | Admitting: Emergency Medicine

## 2018-11-02 DIAGNOSIS — R1013 Epigastric pain: Secondary | ICD-10-CM | POA: Diagnosis present

## 2018-11-02 DIAGNOSIS — F17210 Nicotine dependence, cigarettes, uncomplicated: Secondary | ICD-10-CM | POA: Diagnosis not present

## 2018-11-02 DIAGNOSIS — Z79899 Other long term (current) drug therapy: Secondary | ICD-10-CM | POA: Diagnosis not present

## 2018-11-02 DIAGNOSIS — R109 Unspecified abdominal pain: Secondary | ICD-10-CM

## 2018-11-02 DIAGNOSIS — R112 Nausea with vomiting, unspecified: Secondary | ICD-10-CM | POA: Diagnosis not present

## 2018-11-02 DIAGNOSIS — K2920 Alcoholic gastritis without bleeding: Secondary | ICD-10-CM

## 2018-11-02 HISTORY — DX: Gastric ulcer, unspecified as acute or chronic, without hemorrhage or perforation: K25.9

## 2018-11-02 LAB — URINALYSIS, COMPLETE (UACMP) WITH MICROSCOPIC
Bacteria, UA: NONE SEEN
Bilirubin Urine: NEGATIVE
GLUCOSE, UA: NEGATIVE mg/dL
Hgb urine dipstick: NEGATIVE
Ketones, ur: NEGATIVE mg/dL
Leukocytes, UA: NEGATIVE
NITRITE: NEGATIVE
Protein, ur: NEGATIVE mg/dL
SPECIFIC GRAVITY, URINE: 1.024 (ref 1.005–1.030)
pH: 6 (ref 5.0–8.0)

## 2018-11-02 LAB — COMPREHENSIVE METABOLIC PANEL
ALT: 11 U/L (ref 0–44)
AST: 18 U/L (ref 15–41)
Albumin: 4.3 g/dL (ref 3.5–5.0)
Alkaline Phosphatase: 47 U/L (ref 38–126)
Anion gap: 10 (ref 5–15)
BUN: 9 mg/dL (ref 6–20)
CO2: 27 mmol/L (ref 22–32)
CREATININE: 0.61 mg/dL (ref 0.44–1.00)
Calcium: 9.4 mg/dL (ref 8.9–10.3)
Chloride: 101 mmol/L (ref 98–111)
Glucose, Bld: 108 mg/dL — ABNORMAL HIGH (ref 70–99)
POTASSIUM: 4 mmol/L (ref 3.5–5.1)
Sodium: 138 mmol/L (ref 135–145)
TOTAL PROTEIN: 7.5 g/dL (ref 6.5–8.1)
Total Bilirubin: 1 mg/dL (ref 0.3–1.2)

## 2018-11-02 LAB — CBC
HCT: 34.1 % — ABNORMAL LOW (ref 36.0–46.0)
Hemoglobin: 10.5 g/dL — ABNORMAL LOW (ref 12.0–15.0)
MCH: 28.5 pg (ref 26.0–34.0)
MCHC: 30.8 g/dL (ref 30.0–36.0)
MCV: 92.7 fL (ref 80.0–100.0)
Platelets: 351 10*3/uL (ref 150–400)
RBC: 3.68 MIL/uL — ABNORMAL LOW (ref 3.87–5.11)
RDW: 12.6 % (ref 11.5–15.5)
WBC: 8.4 10*3/uL (ref 4.0–10.5)
nRBC: 0 % (ref 0.0–0.2)

## 2018-11-02 LAB — POCT PREGNANCY, URINE: PREG TEST UR: NEGATIVE

## 2018-11-02 LAB — LIPASE, BLOOD: Lipase: 28 U/L (ref 11–51)

## 2018-11-02 MED ORDER — LIDOCAINE VISCOUS HCL 2 % MT SOLN
15.0000 mL | Freq: Once | OROMUCOSAL | Status: AC
  Administered 2018-11-02: 15 mL via ORAL

## 2018-11-02 MED ORDER — SUCRALFATE 1 G PO TABS
1.0000 g | ORAL_TABLET | Freq: Once | ORAL | Status: AC
  Administered 2018-11-02: 1 g via ORAL

## 2018-11-02 MED ORDER — LIDOCAINE VISCOUS HCL 2 % MT SOLN
OROMUCOSAL | Status: AC
  Administered 2018-11-02: 15 mL via ORAL
  Filled 2018-11-02: qty 15

## 2018-11-02 MED ORDER — ONDANSETRON HCL 4 MG/2ML IJ SOLN
4.0000 mg | Freq: Once | INTRAMUSCULAR | Status: AC
  Administered 2018-11-02: 4 mg via INTRAVENOUS
  Filled 2018-11-02: qty 2

## 2018-11-02 MED ORDER — ALUM & MAG HYDROXIDE-SIMETH 200-200-20 MG/5ML PO SUSP
30.0000 mL | Freq: Once | ORAL | Status: AC
  Administered 2018-11-02: 30 mL via ORAL

## 2018-11-02 MED ORDER — ALUM & MAG HYDROXIDE-SIMETH 200-200-20 MG/5ML PO SUSP
ORAL | Status: AC
  Administered 2018-11-02: 30 mL via ORAL
  Filled 2018-11-02: qty 30

## 2018-11-02 MED ORDER — SUCRALFATE 1 G PO TABS
ORAL_TABLET | ORAL | Status: AC
  Administered 2018-11-02: 1 g via ORAL
  Filled 2018-11-02: qty 1

## 2018-11-02 MED ORDER — FAMOTIDINE IN NACL 20-0.9 MG/50ML-% IV SOLN
20.0000 mg | Freq: Once | INTRAVENOUS | Status: AC
  Administered 2018-11-02: 20 mg via INTRAVENOUS

## 2018-11-02 MED ORDER — FAMOTIDINE IN NACL 20-0.9 MG/50ML-% IV SOLN
INTRAVENOUS | Status: AC
  Administered 2018-11-02: 20 mg via INTRAVENOUS
  Filled 2018-11-02: qty 50

## 2018-11-02 NOTE — Discharge Instructions (Addendum)

## 2018-11-02 NOTE — ED Provider Notes (Signed)
[facility] Emergency Department Provider Note  ____________________________________________  Time seen: Approximately 5:55 PM  I have reviewed the triage vital signs and the nursing notes.   HISTORY  Chief Complaint Abdominal Pain   HPI Carrie Johnson is a 27 y.o. female with a history of peptic ulcer disease, anemia, chlamydia, depression who presents for evaluation of epigastric abdominal pain.  Patient reports 4 days ago was her birthday.  She reports that she drank a lot.  The next day she woke up with a dull pressure-like pain located in the epigastric region, nausea, and several episodes of nonbloody nonbilious emesis.  Her symptoms have been constant for 4 days.  No fever chills, no chest pain or shortness of breath, no dysuria or hematuria, no constipation or diarrhea, no vaginal discharge.  Patient denies taking NSAIDs.  No hematemesis, coffee-ground emesis, or melena.  Patient denies radiation of the pain to her back.  Past Medical History:  Diagnosis Date  . Anemia   . Chlamydia   . Depression   . Stomach ulcer     There are no active problems to display for this patient.   Past Surgical History:  Procedure Laterality Date  . CESAREAN SECTION     x2  . ESOPHAGOGASTRODUODENOSCOPY (EGD) WITH PROPOFOL N/A 01/13/2017   Procedure: ESOPHAGOGASTRODUODENOSCOPY (EGD) WITH PROPOFOL;  Surgeon: Scot Jun, MD;  Location: [facility] ENDOSCOPY;  Service: Endoscopy;  Laterality: N/A;  . TONSILLECTOMY N/A 06/15/2018   Procedure: TONSILLECTOMY;  Surgeon: Bud Face, MD;  Location: [facility];  Service: ENT;  Laterality: N/A;    Prior to Admission medications   Medication Sig Start Date End Date Taking? Authorizing Provider  Acetaminophen 500 MG coapsule Take 1 capsule by mouth as needed for fever.    [provider]  famotidine (PEPCID) 20 MG tablet Take 1 tablet (20 mg total) by mouth 2 (two) times daily. 07/18/18   Emily Filbert, MD  lidocaine (XYLOCAINE) 2 % solution Use as directed 10 mLs in the mouth or throat every 6 (six) hours as needed for mouth pain (Swish and spit). 06/15/18   Vaught, Roney Mans, MD  ondansetron (ZOFRAN) 4 MG tablet Take 1 tablet (4 mg total) by mouth every 8 (eight) hours as needed for up to 10 doses for nausea or vomiting. 06/15/18   Vaught, Roney Mans, MD  oxyCODONE-acetaminophen (PERCOCET) 5-325 MG tablet Take 1 tablet by mouth every 8 (eight) hours as needed. 07/18/18   Emily Filbert, MD  sucralfate (CARAFATE) 1 g tablet Take 1 tablet (1 g total) by mouth 4 (four) times daily. 07/18/18 07/18/19  Emily Filbert, MD    Allergies Patient has no known allergies.  No family history on file.  Social History Social History   Tobacco Use  . Smoking status: Current Every Day Smoker    Packs/day: 0.50    Years: 10.00    Pack years: 5.00    Types: Cigarettes  . Smokeless tobacco: Never Used  Substance Use Topics  . Alcohol use: Yes    Alcohol/week: 1.0 standard drinks    Types: 1 Shots of liquor per week  . Drug use: No    Review of Systems  Constitutional: Negative for fever. Eyes: Negative for visual changes. ENT: Negative for sore throat. Neck: No neck pain  Cardiovascular: Negative for chest pain. Respiratory: Negative for shortness of breath. Gastrointestinal: + epigastric abdominal pain, nausea, and vomiting. No diarrhea. Genitourinary: Negative for dysuria. Musculoskeletal: Negative for back pain.  Skin: Negative for rash. Neurological: Negative for headaches, weakness or numbness. Psych: No SI or HI  ____________________________________________   PHYSICAL EXAM:  VITAL SIGNS: ED Triage Vitals  Enc Vitals Group     BP 11/02/18 1528 115/89     Pulse Rate 11/02/18 1528 85     Resp 11/02/18 1528 16     Temp 11/02/18 1528 98.4 F (36.9 C)     Temp Source 11/02/18 1528 Oral     SpO2 11/02/18 1528 100 %     Weight 11/02/18 1529 140 lb (63.5 kg)      Height 11/02/18 1529 5\' 2"  (1.575 m)     Head Circumference --      Peak Flow --      Pain Score 11/02/18 1529 8     Pain Loc --      Pain Edu? --      Excl. in GC? --     Constitutional: Alert and oriented. Well appearing and in no apparent distress. HEENT:      Head: Normocephalic and atraumatic.         Eyes: Conjunctivae are normal. Sclera is non-icteric.       Mouth/Throat: Mucous membranes are moist.       Neck: Supple with no signs of meningismus. Cardiovascular: Regular rate and rhythm. No murmurs, gallops, or rubs. 2+ symmetrical distal pulses are present in all extremities. No JVD. Respiratory: Normal respiratory effort. Lungs are clear to auscultation bilaterally. No wheezes, crackles, or rhonchi.  Gastrointestinal: Soft, mild tenderness to palpation over the epigastric region, and non distended with positive bowel sounds. No rebound or guarding. Musculoskeletal: Nontender with normal range of motion in all extremities. No edema, cyanosis, or erythema of extremities. Neurologic: Normal speech and language. Face is symmetric. Moving all extremities. No gross focal neurologic deficits are appreciated. Skin: Skin is warm, dry and intact. No rash noted. Psychiatric: Mood and affect are normal. Speech and behavior are normal.  ____________________________________________   LABS (all labs ordered are listed, but only abnormal results are displayed)  Labs Reviewed  COMPREHENSIVE METABOLIC PANEL - Abnormal; Notable for the following components:      Result Value   Glucose, Bld 108 (*)    All other components within normal limits  CBC - Abnormal; Notable for the following components:   RBC 3.68 (*)    Hemoglobin 10.5 (*)    HCT 34.1 (*)    All other components within normal limits  URINALYSIS, COMPLETE (UACMP) WITH MICROSCOPIC - Abnormal; Notable for the following components:   Color, Urine YELLOW (*)    APPearance CLEAR (*)    All other components within normal limits    LIPASE, BLOOD  POC URINE PREG, ED  POCT PREGNANCY, URINE   ____________________________________________  EKG  none  ____________________________________________  RADIOLOGY  I have personally reviewed the images performed during this visit and I agree with the Radiologist's read.   Interpretation by Radiologist:  Dg Abd 2 Views  Result Date: 11/02/2018 CLINICAL DATA:  Intermittent abdominal pain for 4 days. History of gastric ulcer. EXAM: ABDOMEN - 2 VIEW COMPARISON:  CT abdomen and pelvis August 28, 2016. FINDINGS: The bowel gas pattern is normal. No significant retained large bowel stool. There is no evidence of free air. No radio-opaque calculi or other significant radiographic abnormality is seen. IMPRESSION: Negative. Electronically Signed   By: Awilda Metroourtnay  Bloomer M.D.   On: 11/02/2018 17:34     ____________________________________________   PROCEDURES  Procedure(s) performed: None Procedures  Critical Care performed:  None ____________________________________________   INITIAL IMPRESSION / ASSESSMENT AND PLAN / ED COURSE   27 y.o. female with a history of peptic ulcer disease, anemia, chlamydia, depression who presents for evaluation of epigastric abdominal pain, nausea, vomiting in the setting of large alcohol use 4 days ago.  Patient is well-appearing and in no distress, has normal vital signs, abdomen is soft with mild epigastric tenderness with no rebound or guarding.  Differential diagnoses including alcoholic gastritis, peptic ulcer disease, pancreatitis, gallbladder disease.  Labs including CBC, CMP, lipase are all within normal limits.  Negative pregnancy test.  Normal urinalysis.  Patient will be given GI cocktail, sucralfate, Pepcid, Zofran and fluids.  Discuss need to be on PPI for life and alcohol moderation.   Clinical Course as of Nov 03 1839  Wed Nov 02, 2018  1610 Patient is tolerating p.o. with no vomiting, serial abdominal exams remain reassuring with  minimal tenderness.  Discussed alcohol consumption in moderation, bland diet, avoidance of NSAIDs, and the importance of taking PPIs.  At this time her presentation is concerning for alcoholic induced gastritis versus peptic ulcer disease.  Discussed standard return precautions. Patient has a full bottle of pepcid, zofran, and sulcalfrate.    [CV]    Clinical Course User Index [CV] Don Perking Washington, MD     As part of my medical decision making, I reviewed the following data within the electronic MEDICAL RECORD NUMBER Nursing notes reviewed and incorporated, Labs reviewed , Old chart reviewed, Radiograph reviewed , Notes from prior ED visits and [place] Controlled Substance Database    Pertinent labs & imaging results that were available during my care of the patient were reviewed by me and considered in my medical decision making (see chart for details).    ____________________________________________   FINAL CLINICAL IMPRESSION(S) / ED DIAGNOSES  Final diagnoses:  Abdominal pain  Acute alcoholic gastritis without hemorrhage      NEW MEDICATIONS STARTED DURING THIS VISIT:  ED Discharge Orders    None       Note:  This document was prepared using Dragon voice recognition software and may include unintentional dictation errors.    Nita Sickle, MD 11/02/18 [phone]

## 2018-11-02 NOTE — ED Notes (Signed)
AAOx3.  Skin warm and dry.  NAD 

## 2018-11-02 NOTE — ED Notes (Signed)
Pt c/o abd pain with N/V for the past 4 days since binge drinking with friends over the weekend with a known hx of ulcers. Pt is in NAD at present.

## 2018-11-02 NOTE — ED Notes (Signed)
First RN: c/o abd pain, states "I think it's my stomach ulcers."

## 2018-11-02 NOTE — ED Triage Notes (Signed)
Patient reports intermittent abdominal pain for 4 days. Reports history of ulcers. No relief from OTC medications. Reports nausea, denies V/D.

## 2018-11-02 NOTE — ED Notes (Signed)
AAOx3.  Skin warm and dry. NAD.  Patient verbalizing upset at discharge.  Upset that no prescriptions for pain were given.  Reinforced diet and hydration strategies.  Patient not receptive to teaching.    Discharged home.  Ambulates with easy and steady gait.  Posture upright and relaxed.  Freedom of movement.  NAD

## 2018-11-25 ENCOUNTER — Emergency Department
Admission: EM | Admit: 2018-11-25 | Discharge: 2018-11-25 | Disposition: A | Discharge status: Home / Self Care | Payer: Medicaid Other | Attending: Emergency Medicine | Admitting: Emergency Medicine

## 2018-11-25 ENCOUNTER — Encounter: Payer: Self-pay | Admitting: Emergency Medicine

## 2018-11-25 ENCOUNTER — Other Ambulatory Visit: Payer: Self-pay

## 2018-11-25 DIAGNOSIS — N760 Acute vaginitis: Secondary | ICD-10-CM | POA: Insufficient documentation

## 2018-11-25 DIAGNOSIS — Z79899 Other long term (current) drug therapy: Secondary | ICD-10-CM | POA: Insufficient documentation

## 2018-11-25 DIAGNOSIS — B9689 Other specified bacterial agents as the cause of diseases classified elsewhere: Secondary | ICD-10-CM

## 2018-11-25 DIAGNOSIS — F17210 Nicotine dependence, cigarettes, uncomplicated: Secondary | ICD-10-CM | POA: Insufficient documentation

## 2018-11-25 DIAGNOSIS — N898 Other specified noninflammatory disorders of vagina: Secondary | ICD-10-CM | POA: Diagnosis present

## 2018-11-25 LAB — URINALYSIS, COMPLETE (UACMP) WITH MICROSCOPIC
Bacteria, UA: NONE SEEN
Bilirubin Urine: NEGATIVE
GLUCOSE, UA: NEGATIVE mg/dL
Hgb urine dipstick: NEGATIVE
Ketones, ur: NEGATIVE mg/dL
Leukocytes, UA: NEGATIVE
Nitrite: NEGATIVE
PROTEIN: NEGATIVE mg/dL
Specific Gravity, Urine: 1.027 (ref 1.005–1.030)
pH: 6 (ref 5.0–8.0)

## 2018-11-25 LAB — WET PREP, GENITAL
Sperm: NONE SEEN
TRICH WET PREP: NONE SEEN
Yeast Wet Prep HPF POC: NONE SEEN

## 2018-11-25 LAB — CHLAMYDIA/NGC RT PCR (ARMC ONLY)
CHLAMYDIA TR: NOT DETECTED
N gonorrhoeae: NOT DETECTED

## 2018-11-25 LAB — POCT PREGNANCY, URINE: Preg Test, Ur: NEGATIVE

## 2018-11-25 NOTE — ED Notes (Signed)
Per assessment pt in NAD, pt reports vaginal itching but denies abnormal discharge

## 2018-11-25 NOTE — Discharge Instructions (Signed)
Your gonorrhea and Chlamydia results are pending.  You can call the hospital tomorrow for your results.

## 2018-11-25 NOTE — ED Triage Notes (Signed)
C/O vaginal irritation.

## 2018-11-25 NOTE — ED Provider Notes (Signed)
[facility] Emergency Department Provider Note  ____________________________________________  Time seen: Approximately 3:56 PM  I have reviewed the triage vital signs and the nursing notes.   HISTORY  Chief Complaint vaginal irritation    HPI Carrie Johnson is a 27 y.o. female presents emergency department for evaluation of vaginal itching for 1 day.  Patient has had new sexual partner.  No abdominal pain, dysuria, urgency, vaginal discharge.  Past Medical History:  Diagnosis Date  . Anemia   . Chlamydia   . Depression   . Stomach ulcer     There are no active problems to display for this patient.   Past Surgical History:  Procedure Laterality Date  . CESAREAN SECTION     x2  . ESOPHAGOGASTRODUODENOSCOPY (EGD) WITH PROPOFOL N/A 01/13/2017   Procedure: ESOPHAGOGASTRODUODENOSCOPY (EGD) WITH PROPOFOL;  Surgeon: Scot Junobert T Elliott, MD;  [facility];  Service: Endoscopy;  Laterality: N/A;  . TONSILLECTOMY N/A 06/15/2018   Procedure: TONSILLECTOMY;  Surgeon: Bud FaceVaught, Creighton, MD;  [facility];  Service: ENT;  Laterality: N/A;    Prior to Admission medications   Medication Sig Start Date End Date Taking? Authorizing Provider  Acetaminophen 500 MG coapsule Take 1 capsule by mouth as needed for fever.    [provider]  famotidine (PEPCID) 20 MG tablet Take 1 tablet (20 mg total) by mouth 2 (two) times daily. 07/18/18   Emily FilbertWilliams, Jonathan E, MD  lidocaine (XYLOCAINE) 2 % solution Use as directed 10 mLs in the mouth or throat every 6 (six) hours as needed for mouth pain (Swish and spit). 06/15/18   Vaught, Roney Mansreighton, MD  ondansetron (ZOFRAN) 4 MG tablet Take 1 tablet (4 mg total) by mouth every 8 (eight) hours as needed for up to 10 doses for nausea or vomiting. 06/15/18   Vaught, Roney Mansreighton, MD  oxyCODONE-acetaminophen (PERCOCET) 5-325 MG tablet Take 1 tablet by mouth every 8 (eight) hours as needed. 07/18/18   Emily FilbertWilliams,  Jonathan E, MD  sucralfate (CARAFATE) 1 g tablet Take 1 tablet (1 g total) by mouth 4 (four) times daily. 07/18/18 07/18/19  Emily FilbertWilliams, Jonathan E, MD    Allergies Patient has no known allergies.  No family history on file.  Social History Social History   Tobacco Use  . Smoking status: Current Every Day Smoker    Packs/day: 0.50    Years: 10.00    Pack years: 5.00    Types: Cigarettes  . Smokeless tobacco: Never Used  Substance Use Topics  . Alcohol use: Yes    Alcohol/week: 1.0 standard drinks    Types: 1 Shots of liquor per week  . Drug use: No     Review of Systems  Constitutional: No fever/chills Gastrointestinal: No abdominal pain.  No nausea, no vomiting.  Genitourinary: Negative for dysuria. Musculoskeletal: Negative for musculoskeletal pain. Skin: Negative for rash, abrasions, lacerations, ecchymosis.   ____________________________________________   PHYSICAL EXAM:  VITAL SIGNS: ED Triage Vitals  Enc Vitals Group     BP 11/25/18 1357 120/84     Pulse Rate 11/25/18 1357 84     Resp 11/25/18 1357 18     Temp 11/25/18 1357 98.1 F (36.7 C)     Temp Source 11/25/18 1357 Oral     SpO2 11/25/18 1357 100 %     Weight 11/25/18 1351 139 lb 15.9 oz (63.5 kg)     Height --      Head Circumference --      Peak Flow --  Pain Score 11/25/18 1350 5     Pain Loc --      Pain Edu? --      Excl. in GC? --      Constitutional: Alert and oriented. Well appearing and in no acute distress. Eyes: Conjunctivae are normal. PERRL. EOMI. Head: Atraumatic. ENT:      Ears:      Nose: No congestion/rhinnorhea.      Mouth/Throat: Mucous membranes are moist.  Neck: No stridor.   Cardiovascular: Normal rate, regular rhythm.  Good peripheral circulation. Respiratory: Normal respiratory effort without tachypnea or retractions. Lungs CTAB. Good air entry to the bases with no decreased or absent breath sounds. Gastrointestinal: Bowel sounds 4 quadrants. Soft and nontender to  palpation. No guarding or rigidity. No palpable masses. No distention.  Genitourinary: No external rashes or lesions.  No vaginal discharge.  No cervical motion tenderness. Musculoskeletal: Full range of motion to all extremities. No gross deformities appreciated. Neurologic:  Normal speech and language. No gross focal neurologic deficits are appreciated.  Skin:  Skin is warm, dry and intact. No rash noted. Psychiatric: Mood and affect are normal. Speech and behavior are normal. Patient exhibits appropriate insight and judgement.   ____________________________________________   LABS (all labs ordered are listed, but only abnormal results are displayed)  Labs Reviewed  WET PREP, GENITAL - Abnormal; Notable for the following components:      Result Value   Clue Cells Wet Prep HPF POC PRESENT (*)    WBC, Wet Prep HPF POC MANY (*)    All other components within normal limits  URINALYSIS, COMPLETE (UACMP) WITH MICROSCOPIC - Abnormal; Notable for the following components:   Color, Urine YELLOW (*)    APPearance CLEAR (*)    All other components within normal limits  CHLAMYDIA/NGC RT PCR ([facility] ONLY)  POC URINE PREG, ED  POCT PREGNANCY, URINE   ____________________________________________  EKG   ____________________________________________  RADIOLOGY   No results found.  ____________________________________________    PROCEDURES  Procedure(s) performed:    Procedures    Medications - No data to display   ____________________________________________   INITIAL IMPRESSION / ASSESSMENT AND PLAN / ED COURSE  Pertinent labs & imaging results that were available during my care of the patient were reviewed by me and considered in my medical decision making (see chart for details).  Review of the [place] CSRS was performed in accordance of the [facility] prior to dispensing any controlled drugs.     Patient's diagnosis is consistent with bacterial vaginosis.  Vital  signs and exam are reassuring.  No infection on urinalysis.  Wet prep consistent with bacterial vaginosis.  Patient states that she gets bacterial vaginosis frequently and has prescription for medication at home.  Gonorrhea and chlamydia are pending.  Patient does not want to be treated for STDs and will follow-up with health department and call emergency room for results.  Patient is to follow up with health department as directed. Patient is given ED precautions to return to the ED for any worsening or new symptoms.     ____________________________________________  FINAL CLINICAL IMPRESSION(S) / ED DIAGNOSES  Final diagnoses:  Bacterial vaginosis      NEW MEDICATIONS STARTED DURING THIS VISIT:  ED Discharge Orders    None          This chart was dictated using voice recognition software/Dragon. Despite best efforts to proofread, errors can occur which can change the meaning. Any change was purely unintentional.    Loreta Ave,  Morrie Sheldon, PA-C 11/25/18 1558    Dionne Bucy, MD 11/25/18 [phone]

## 2018-11-25 NOTE — ED Notes (Signed)
Pt not in room for written instructions  Was given verbal instructions by Provider

## 2019-01-19 ENCOUNTER — Emergency Department
Admission: EM | Admit: 2019-01-19 | Discharge: 2019-01-19 | Disposition: A | Discharge status: Home / Self Care | Payer: Medicaid Other | Attending: Emergency Medicine | Admitting: Emergency Medicine

## 2019-01-19 ENCOUNTER — Encounter: Payer: Self-pay | Admitting: Emergency Medicine

## 2019-01-19 DIAGNOSIS — M7918 Myalgia, other site: Secondary | ICD-10-CM | POA: Diagnosis not present

## 2019-01-19 DIAGNOSIS — F17210 Nicotine dependence, cigarettes, uncomplicated: Secondary | ICD-10-CM | POA: Diagnosis not present

## 2019-01-19 DIAGNOSIS — R0981 Nasal congestion: Secondary | ICD-10-CM | POA: Diagnosis not present

## 2019-01-19 DIAGNOSIS — R05 Cough: Secondary | ICD-10-CM | POA: Diagnosis not present

## 2019-01-19 DIAGNOSIS — R69 Illness, unspecified: Secondary | ICD-10-CM

## 2019-01-19 DIAGNOSIS — J111 Influenza due to unidentified influenza virus with other respiratory manifestations: Secondary | ICD-10-CM | POA: Diagnosis not present

## 2019-01-19 DIAGNOSIS — R509 Fever, unspecified: Secondary | ICD-10-CM | POA: Diagnosis present

## 2019-01-19 DIAGNOSIS — Z79899 Other long term (current) drug therapy: Secondary | ICD-10-CM | POA: Diagnosis not present

## 2019-01-19 MED ORDER — PSEUDOEPH-BROMPHEN-DM 30-2-10 MG/5ML PO SYRP: 5.0000 mL | ORAL_SOLUTION | Freq: Four times a day (QID) | ORAL | 0 refills | Status: AC | PRN

## 2019-01-19 MED ORDER — IBUPROFEN 100 MG/5ML PO SUSP: 5.0000 mg/kg | Freq: Once | ORAL | Status: DC

## 2019-01-19 MED ORDER — OSELTAMIVIR PHOSPHATE 75 MG PO CAPS: 75.0000 mg | ORAL_CAPSULE | Freq: Two times a day (BID) | ORAL | 0 refills | Status: AC

## 2019-01-19 MED ORDER — ACETAMINOPHEN 325 MG PO TABS
650.0000 mg | ORAL_TABLET | Freq: Once | ORAL | Status: AC
  Administered 2019-01-19: 650 mg via ORAL
  Filled 2019-01-19: qty 2

## 2019-01-19 MED ORDER — IBUPROFEN 600 MG PO TABS: 600.0000 mg | ORAL_TABLET | Freq: Three times a day (TID) | ORAL | 0 refills | Status: AC | PRN

## 2019-01-19 NOTE — ED Provider Notes (Signed)
[facility] Emergency Department Provider Note   ____________________________________________   First MD Initiated Contact with Patient 01/19/19 [phone]     (approximate)  I have reviewed the triage vital signs and the nursing notes.   HISTORY  Chief Complaint Fever; Cough; Nasal Congestion; and Generalized Body Aches    HPI Carrie Johnson is a 28 y.o. female patient presents with generalized body aches, nasal congestion, nonproductive cough, and headache.  Patient denies nausea vomiting.  Patient said one episode of loose stool yesterday.  Patient has not taken flu shot for this season.  Patient also have a sibling who is febrile.  Patient rates her pain discomfort as a 3/10.  Patient describes her pain is "achy".  No palliative measure for complaint.    Past Medical History:  Diagnosis Date  . Anemia   . Chlamydia   . Depression   . Stomach ulcer     There are no active problems to display for this patient.   Past Surgical History:  Procedure Laterality Date  . CESAREAN SECTION     x2  . ESOPHAGOGASTRODUODENOSCOPY (EGD) WITH PROPOFOL N/A 01/13/2017   Procedure: ESOPHAGOGASTRODUODENOSCOPY (EGD) WITH PROPOFOL;  Surgeon: Scot Jun, MD;  [facility] ENDOSCOPY;  Service: Endoscopy;  Laterality: N/A;  . TONSILLECTOMY N/A 06/15/2018   Procedure: TONSILLECTOMY;  Surgeon: Bud Face, MD;  Location: [facility];  Service: ENT;  Laterality: N/A;    Prior to Admission medications   Medication Sig Start Date End Date Taking? Authorizing Provider  Acetaminophen 500 MG coapsule Take 1 capsule by mouth as needed for fever.    [provider]  brompheniramine-pseudoephedrine-DM 30-2-10 MG/5ML syrup Take 5 mLs by mouth 4 (four) times daily as needed. 01/19/19   Joni Reining, PA-C  famotidine (PEPCID) 20 MG tablet Take 1 tablet (20 mg total) by mouth 2 (two) times daily. 07/18/18   Emily Filbert, MD  ibuprofen  (ADVIL,MOTRIN) 600 MG tablet Take 1 tablet (600 mg total) by mouth every 8 (eight) hours as needed. 01/19/19   Joni Reining, PA-C  lidocaine (XYLOCAINE) 2 % solution Use as directed 10 mLs in the mouth or throat every 6 (six) hours as needed for mouth pain (Swish and spit). 06/15/18   Vaught, Roney Mans, MD  ondansetron (ZOFRAN) 4 MG tablet Take 1 tablet (4 mg total) by mouth every 8 (eight) hours as needed for up to 10 doses for nausea or vomiting. 06/15/18   Bud Face, MD  oseltamivir (TAMIFLU) 75 MG capsule Take 1 capsule (75 mg total) by mouth 2 (two) times daily for 5 days. 01/19/19 01/24/19  Joni Reining, PA-C  oxyCODONE-acetaminophen (PERCOCET) 5-325 MG tablet Take 1 tablet by mouth every 8 (eight) hours as needed. 07/18/18   Emily Filbert, MD  sucralfate (CARAFATE) 1 g tablet Take 1 tablet (1 g total) by mouth 4 (four) times daily. 07/18/18 07/18/19  Emily Filbert, MD    Allergies Patient has no known allergies.  No family history on file.  Social History Social History   Tobacco Use  . Smoking status: Current Every Day Smoker    Packs/day: 0.50    Years: 10.00    Pack years: 5.00    Types: Cigarettes  . Smokeless tobacco: Never Used  Substance Use Topics  . Alcohol use: Yes    Alcohol/week: 1.0 standard drinks    Types: 1 Shots of liquor per week  . Drug use: No    Review of  Systems  Constitutional: No fever/chills Eyes: No visual changes. ENT: No sore throat. Cardiovascular: Denies chest pain. Respiratory: Denies shortness of breath. Gastrointestinal: No abdominal pain.  No nausea, no vomiting.  No diarrhea.  No constipation. Genitourinary: Negative for dysuria. Musculoskeletal: Negative for back pain. Skin: Negative for rash. Neurological: Negative for headaches, focal weakness or numbness. Psychiatric:  Depression Hematological/Lymphatic:  Anemia   ____________________________________________   PHYSICAL EXAM:  VITAL SIGNS: ED Triage Vitals    Enc Vitals Group     BP 01/19/19 0738 117/74     Pulse Rate 01/19/19 0738 99     Resp 01/19/19 0738 20     Temp 01/19/19 0738 99.3 F (37.4 C)     Temp Source 01/19/19 0738 Oral     SpO2 01/19/19 0738 97 %     Weight 01/19/19 0734 149 lb (67.6 kg)     Height 01/19/19 0734 5\' 2"  (1.575 m)     Head Circumference --      Peak Flow --      Pain Score 01/19/19 0734 3     Pain Loc --      Pain Edu? --      Excl. in GC? --     Constitutional: Alert and oriented. Well appearing and in no acute distress. Eyes: Conjunctivae are normal. PERRL. EOMI. Head: Atraumatic. Nose: Clear rhinorrhea Mouth/Throat: Mucous membranes are moist.  Oropharynx non-erythematous.  Postnasal drainage Neck: No stridor. Hematological/Lymphatic/Immunilogical: No cervical lymphadenopathy. Cardiovascular: Normal rate, regular rhythm. Grossly normal heart sounds.  Good peripheral circulation. Respiratory: Normal respiratory effort.  No retractions. Lungs CTAB. Gastrointestinal: Soft and nontender. No distention. No abdominal bruits. No CVA tenderness. Neurologic:  Normal speech and language. No gross focal neurologic deficits are appreciated. No gait instability. Skin:  Skin is warm, dry and intact. No rash noted. Psychiatric: Mood and affect are normal. Speech and behavior are normal.  ____________________________________________   LABS (all labs ordered are listed, but only abnormal results are displayed)  Labs Reviewed - No data to display ____________________________________________  EKG   ____________________________________________  RADIOLOGY  ED MD interpretation:    Official radiology report(s): No results found.  ____________________________________________   PROCEDURES  Procedure(s) performed: None  Procedures  Critical Care performed: No  ____________________________________________   INITIAL IMPRESSION / ASSESSMENT AND PLAN / ED COURSE  As part of my medical decision  making, I reviewed the following data within the electronic MEDICAL RECORD NUMBER    Patient presents with viral respiratory infection for a few days.  Today her son tested positive for influenza A.  Patient given discharge care instruction advised take medication as directed.  Patient advised follow-up PCP.      ____________________________________________   FINAL CLINICAL IMPRESSION(S) / ED DIAGNOSES  Final diagnoses:  Influenza-like illness     ED Discharge Orders         Ordered    oseltamivir (TAMIFLU) 75 MG capsule  2 times daily     01/19/19 0857    brompheniramine-pseudoephedrine-DM 30-2-10 MG/5ML syrup  4 times daily PRN     01/19/19 0857    ibuprofen (ADVIL,MOTRIN) 600 MG tablet  Every 8 hours PRN     01/19/19 0857           Note:  This document was prepared using Dragon voice recognition software and may include unintentional dictation errors.    Joni Reining, PA-C 01/19/19 0900    Schaevitz, Myra Rude, MD 01/19/19 [phone]

## 2019-01-19 NOTE — ED Notes (Signed)
See triage note  States she developed body aches and fever about 4 days ago  Low grade fever on arrival

## 2019-01-19 NOTE — Discharge Instructions (Addendum)
Take medication as directed.

## 2019-01-19 NOTE — ED Triage Notes (Signed)
Pt reports flu like sx's that started 4 days ago.

## 2019-01-19 NOTE — ED Notes (Signed)
Pt given apple juice  

## 2019-08-18 ENCOUNTER — Other Ambulatory Visit: Payer: Self-pay

## 2019-08-18 ENCOUNTER — Encounter: Payer: Self-pay | Admitting: Emergency Medicine

## 2019-08-18 ENCOUNTER — Emergency Department
Admission: EM | Admit: 2019-08-18 | Discharge: 2019-08-18 | Disposition: A | Discharge status: Home / Self Care | Payer: Medicaid Other | Attending: Emergency Medicine | Admitting: Emergency Medicine

## 2019-08-18 ENCOUNTER — Emergency Department: Payer: Medicaid Other

## 2019-08-18 DIAGNOSIS — F17210 Nicotine dependence, cigarettes, uncomplicated: Secondary | ICD-10-CM | POA: Diagnosis not present

## 2019-08-18 DIAGNOSIS — Y999 Unspecified external cause status: Secondary | ICD-10-CM | POA: Diagnosis not present

## 2019-08-18 DIAGNOSIS — Y9389 Activity, other specified: Secondary | ICD-10-CM | POA: Diagnosis not present

## 2019-08-18 DIAGNOSIS — S40022A Contusion of left upper arm, initial encounter: Secondary | ICD-10-CM

## 2019-08-18 DIAGNOSIS — S4992XA Unspecified injury of left shoulder and upper arm, initial encounter: Secondary | ICD-10-CM | POA: Diagnosis present

## 2019-08-18 DIAGNOSIS — Y929 Unspecified place or not applicable: Secondary | ICD-10-CM | POA: Diagnosis not present

## 2019-08-18 DIAGNOSIS — R51 Headache: Secondary | ICD-10-CM | POA: Insufficient documentation

## 2019-08-18 NOTE — ED Notes (Signed)
Patient with bruising to left forehead and check. Patient also has bruising to left upper leg. Patient states that she has a lot of pain when trying to lift her left arm.
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
## 2019-11-04 ENCOUNTER — Other Ambulatory Visit: Payer: Self-pay

## 2019-11-04 DIAGNOSIS — Z20822 Contact with and (suspected) exposure to covid-19: Secondary | ICD-10-CM

## 2019-11-06 LAB — NOVEL CORONAVIRUS, NAA: SARS-CoV-2, NAA: NOT DETECTED

## 2020-02-23 ENCOUNTER — Ambulatory Visit: Payer: Self-pay

## 2020-07-04 ENCOUNTER — Other Ambulatory Visit: Payer: Self-pay | Admitting: Surgery

## 2020-07-04 ENCOUNTER — Other Ambulatory Visit: Payer: Self-pay | Admitting: Family Medicine

## 2020-07-04 ENCOUNTER — Other Ambulatory Visit: Payer: Self-pay | Admitting: Nurse Practitioner

## 2020-07-04 DIAGNOSIS — Z01818 Encounter for other preprocedural examination: Secondary | ICD-10-CM

## 2020-07-04 DIAGNOSIS — Z1231 Encounter for screening mammogram for malignant neoplasm of breast: Secondary | ICD-10-CM

## 2020-07-10 ENCOUNTER — Other Ambulatory Visit: Payer: Self-pay

## 2020-07-10 ENCOUNTER — Ambulatory Visit
Admission: RE | Admit: 2020-07-10 | Discharge: 2020-07-10 | Disposition: A | Discharge status: Home / Self Care | Payer: Medicaid Other | Source: Ambulatory Visit | Attending: Family Medicine | Admitting: Family Medicine

## 2020-07-10 DIAGNOSIS — Z1231 Encounter for screening mammogram for malignant neoplasm of breast: Secondary | ICD-10-CM | POA: Diagnosis not present

## 2020-10-24 IMAGING — MG DIGITAL SCREENING BILAT W/ TOMO W/ CAD
8 series · 9 of 24 positions shown · non-contrast
Comparison: None.

CLINICAL DATA: Screening.

EXAM:
DIGITAL SCREENING BILATERAL MAMMOGRAM WITH TOMO AND CAD

[L MLO synth-2D]
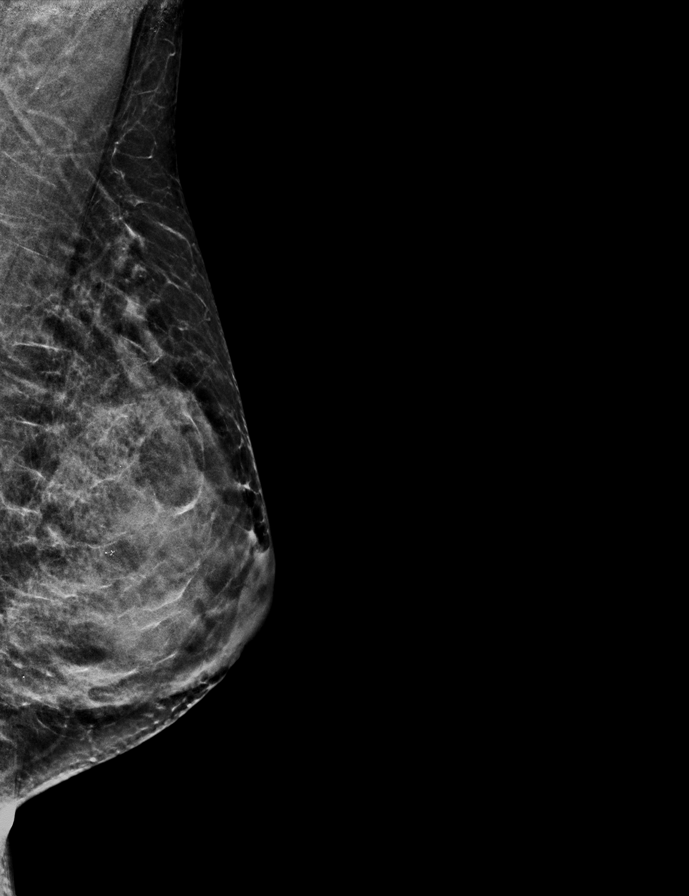

[R MLO synth-2D]
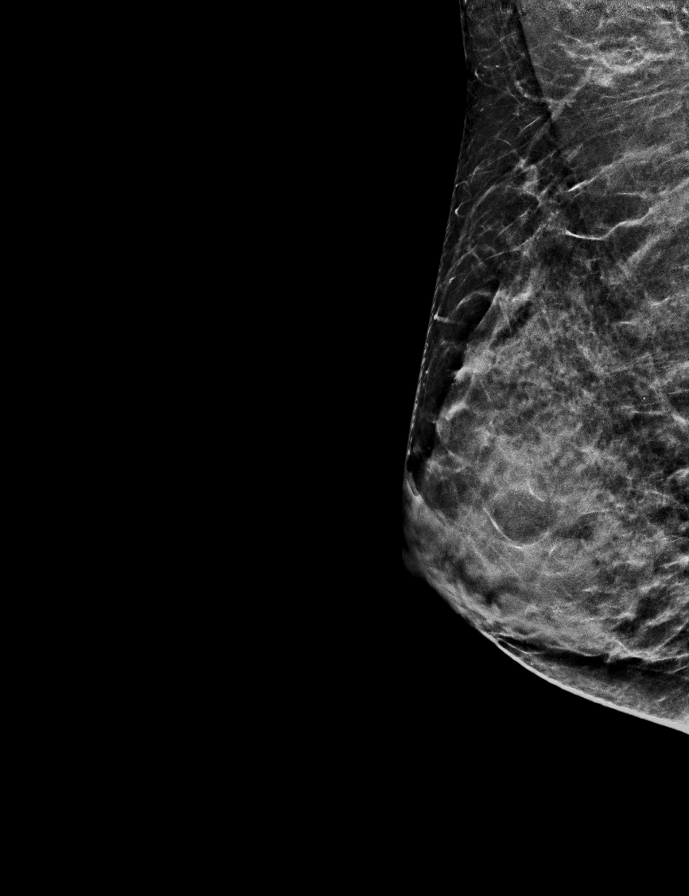

[L CC synth-2D]
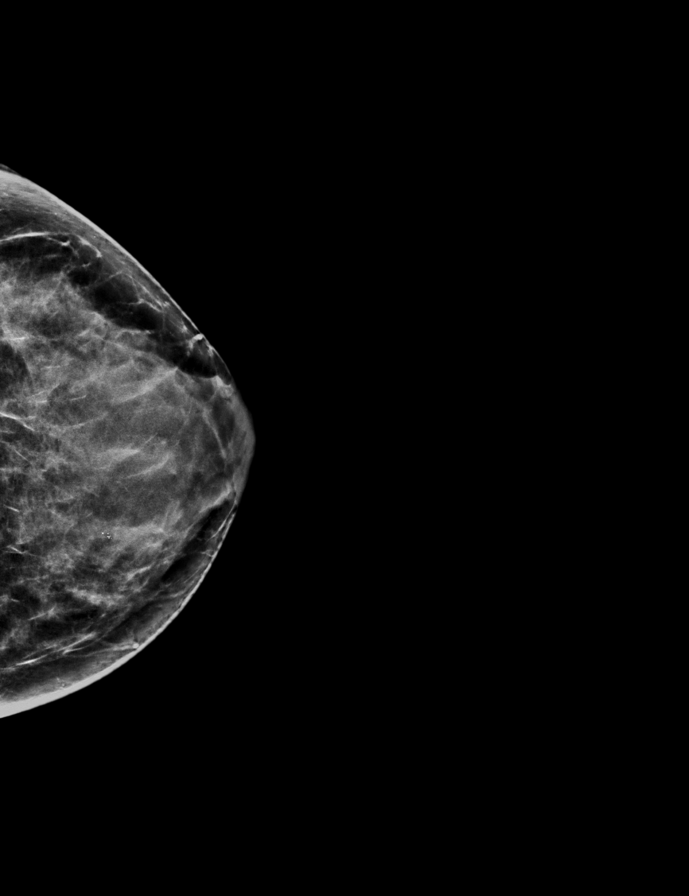

[R CC synth-2D]
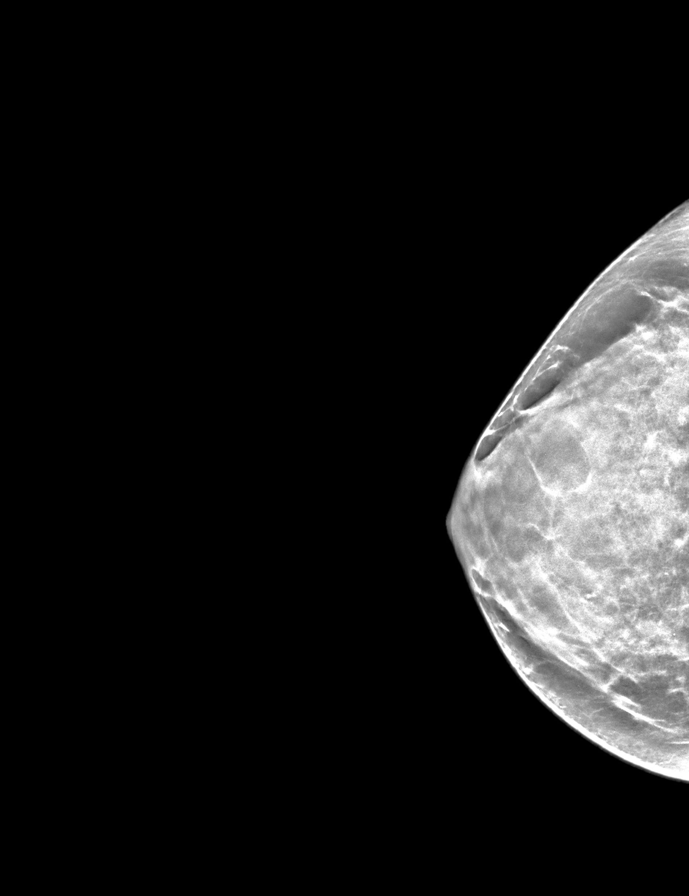

[L CC tomo · 2 of 58 frames shown]
[frame 19/58]
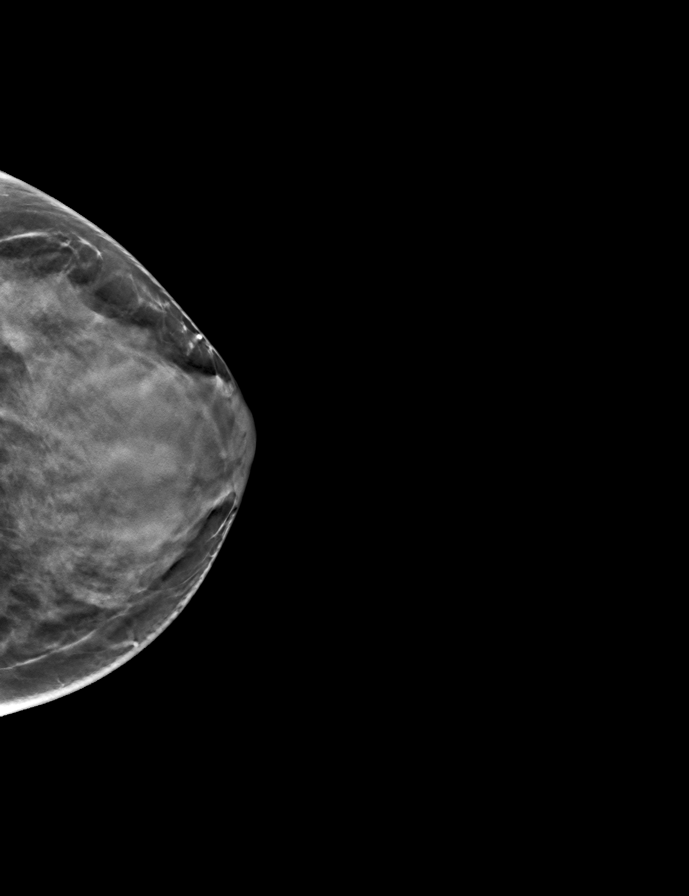
[frame 29/58]
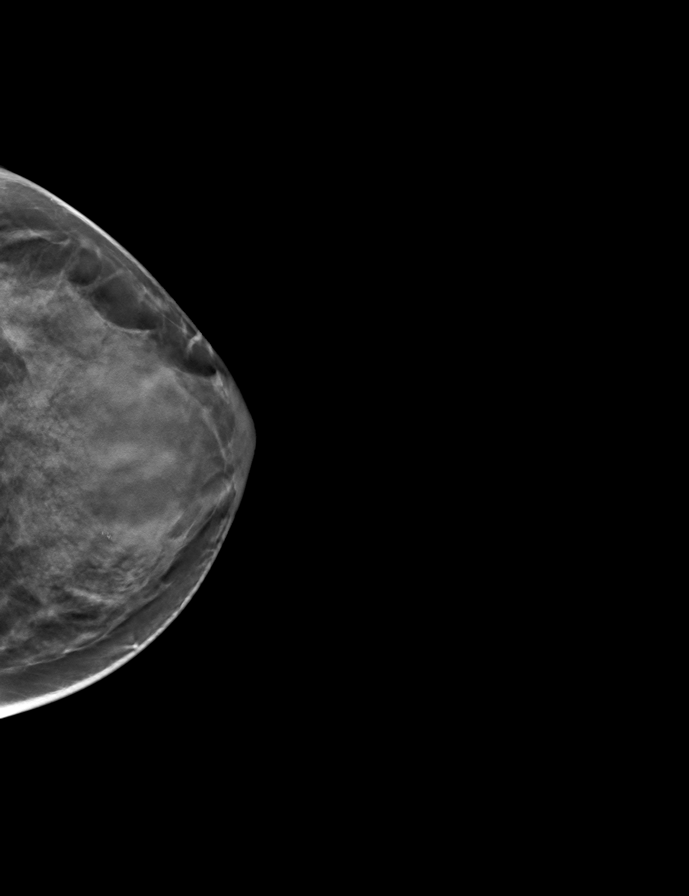

[R MLO tomo · tomo slice 25/50.0]
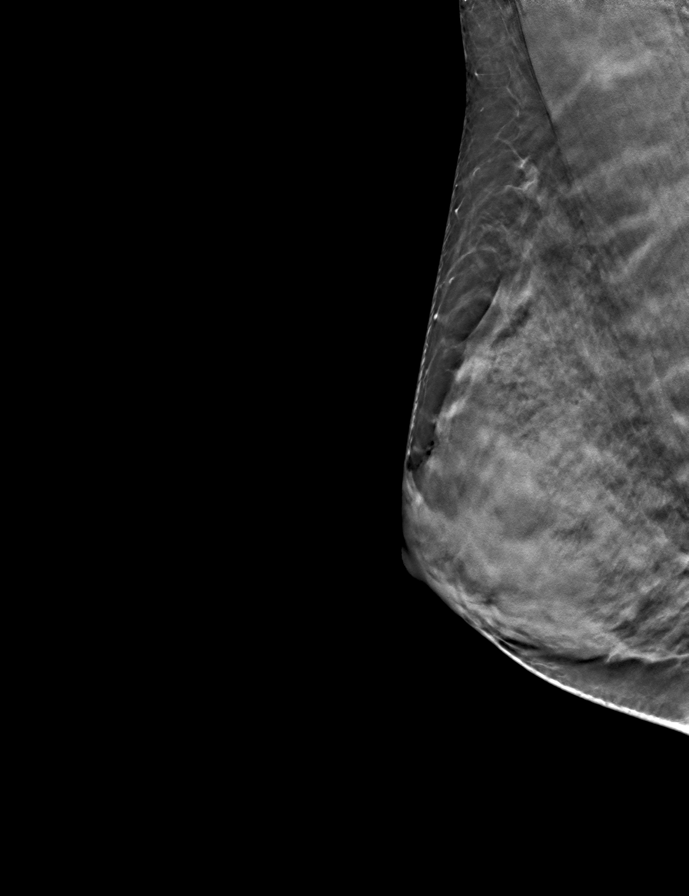

[L MLO tomo · tomo slice 27/53.0]
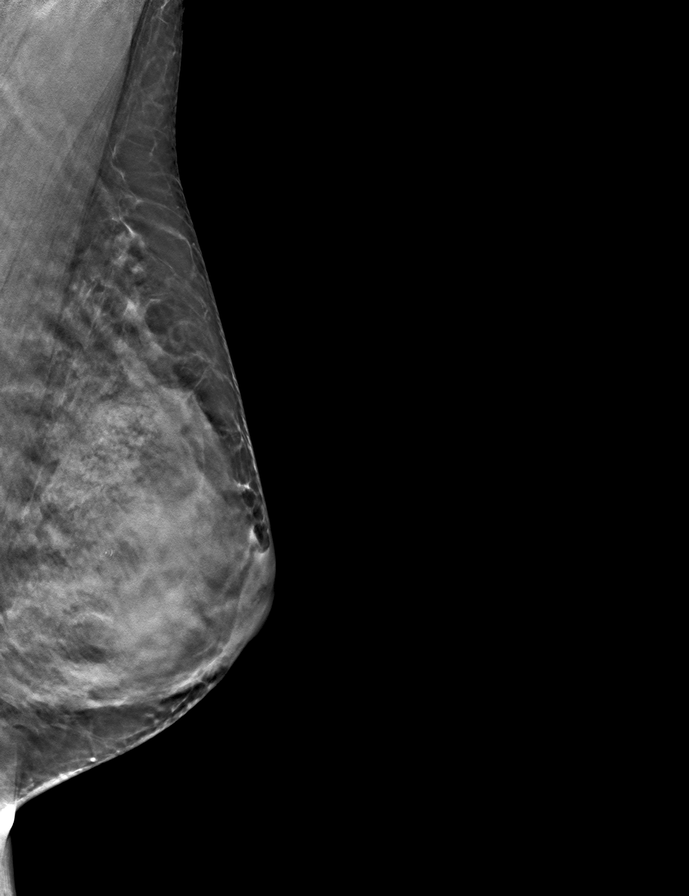

[R CC tomo · tomo slice 27/53.0]
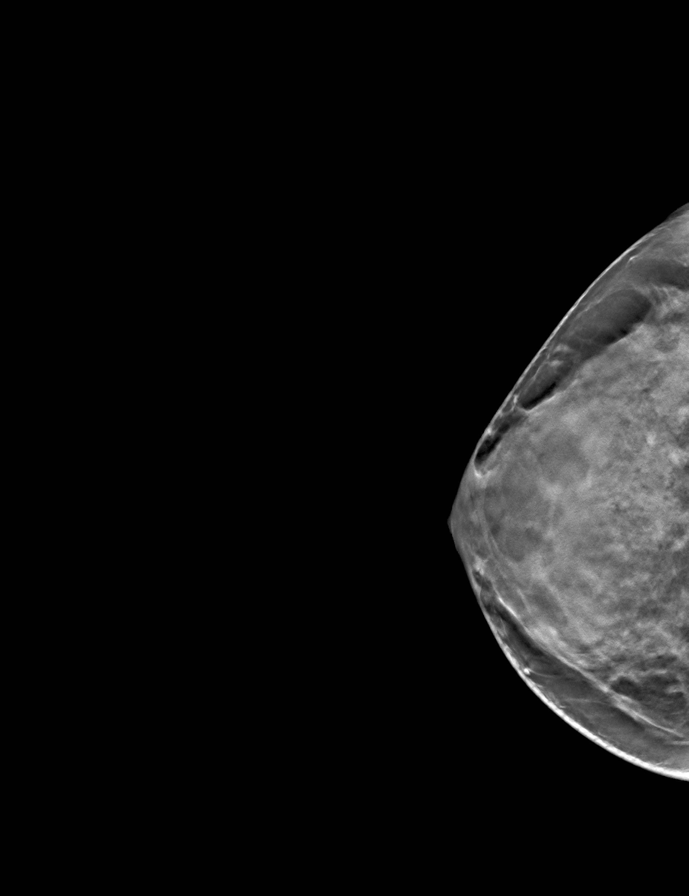

[9 of 24 positions shown; findings below may reference images not displayed]

ACR Breast Density Category c: The breast tissue is heterogeneously
dense, which may obscure small masses
FINDINGS: There are no findings suspicious for malignancy. Images were
processed with CAD.
IMPRESSION: No mammographic evidence of malignancy. A result letter of this
screening mammogram will be mailed directly to the patient.

RECOMMENDATION:
Screening mammogram at age 40. (Code:NM-7-TKP)

BI-RADS CATEGORY  1: Negative.

## 2021-02-17 ENCOUNTER — Other Ambulatory Visit: Payer: Self-pay

## 2021-02-17 ENCOUNTER — Ambulatory Visit (HOSPITAL_COMMUNITY)
Admission: EM | Admit: 2021-02-17 | Discharge: 2021-02-17 | Disposition: A | Discharge status: Home / Self Care | Payer: Medicaid Other | Attending: Student | Admitting: Student

## 2021-02-17 ENCOUNTER — Encounter (HOSPITAL_COMMUNITY): Payer: Self-pay

## 2021-02-17 DIAGNOSIS — Z3200 Encounter for pregnancy test, result unknown: Secondary | ICD-10-CM | POA: Insufficient documentation

## 2021-02-17 DIAGNOSIS — Z3202 Encounter for pregnancy test, result negative: Secondary | ICD-10-CM | POA: Diagnosis present

## 2021-02-17 DIAGNOSIS — G44209 Tension-type headache, unspecified, not intractable: Secondary | ICD-10-CM | POA: Diagnosis present

## 2021-02-17 LAB — POC URINE PREG, ED: Preg Test, Ur: NEGATIVE

## 2021-02-17 LAB — HCG, QUANTITATIVE, PREGNANCY: hCG, Beta Chain, Quant, S: <1 m[IU]/mL (ref <5)

## 2021-02-17 NOTE — Discharge Instructions (Addendum)
-  We are doing a blood test for pregnancy.  Should come back tomorrow. -If you develop the worst headache of her life, vision changes, sensitivity to light, pain or weakness in 1 arm or 1 leg, dizziness, fainting-head straight to the ER. -If you develop abdominal pain, especially with vaginal bleeding, vaginal discharge, urinary symptoms-head straight to the ER.

## 2021-02-17 NOTE — ED Provider Notes (Addendum)
[facility]    CSN: [id number] Arrival date & time: 02/17/21  1746      History   Chief Complaint Chief Complaint  Patient presents with  . Headache  . Possible Pregnancy    HPI Carrie Johnson is a 30 y.o. female  Presenting with headache and possible pregnancy. History anemia, chlamydia, depression, stomach ulcer. Pt presents with a 6/10 headache 1 day. She states she took Tylenol and had no relief. She states she felt faint at work for about a minute but this passed on its own. states she thought she was pregnant as this has happened with her past pregnancies. Pt states she has taken 3 pregnancy test at home that have had faint positives. Pt denies abdominal pain  nausea and vomiting. Denies worst headache of life, thunderclap headache, weakness/sensation changes in arms/legs, vision changes, shortness of breath, chest pain/pressure, photophobia, phonophobia, n/v/d.    HPI  Past Medical History:  Diagnosis Date  . Anemia   . Chlamydia   . Depression   . Stomach ulcer     There are no problems to display for this patient.   Past Surgical History:  Procedure Laterality Date  . CESAREAN SECTION     x2  . ESOPHAGOGASTRODUODENOSCOPY (EGD) WITH PROPOFOL N/A 01/13/2017   Procedure: ESOPHAGOGASTRODUODENOSCOPY (EGD) WITH PROPOFOL;  Surgeon: Scot Jun, MD;  Location: [facility] ENDOSCOPY;  Service: Endoscopy;  Laterality: N/A;  . TONSILLECTOMY N/A 06/15/2018   Procedure: TONSILLECTOMY;  Surgeon: Bud Face, MD;  Location: [facility];  Service: ENT;  Laterality: N/A;    OB History    Gravida  4   Para  2   Term      Preterm      AB      Living        SAB      IAB      Ectopic      Multiple      Live Births               Home Medications    Prior to Admission medications   Medication Sig Start Date End Date Taking? Authorizing Provider  Acetaminophen 500 MG coapsule Take 1 capsule by mouth as needed for fever.    [provider]  brompheniramine-pseudoephedrine-DM 30-2-10 MG/5ML syrup Take 5 mLs by mouth 4 (four) times daily as needed. 01/19/19   Joni Reining, PA-C  famotidine (PEPCID) 20 MG tablet Take 1 tablet (20 mg total) by mouth 2 (two) times daily. 07/18/18   Emily Filbert, MD  ibuprofen (ADVIL,MOTRIN) 600 MG tablet Take 1 tablet (600 mg total) by mouth every 8 (eight) hours as needed. 01/19/19   Joni Reining, PA-C  lidocaine (XYLOCAINE) 2 % solution Use as directed 10 mLs in the mouth or throat every 6 (six) hours as needed for mouth pain (Swish and spit). 06/15/18   Vaught, Roney Mans, MD  ondansetron (ZOFRAN) 4 MG tablet Take 1 tablet (4 mg total) by mouth every 8 (eight) hours as needed for up to 10 doses for nausea or vomiting. 06/15/18   Vaught, Roney Mans, MD  oxyCODONE-acetaminophen (PERCOCET) 5-325 MG tablet Take 1 tablet by mouth every 8 (eight) hours as needed. 07/18/18   Emily Filbert, MD  sucralfate (CARAFATE) 1 g tablet Take 1 tablet (1 g total) by mouth 4 (four) times daily. 07/18/18 07/18/19  Emily Filbert, MD    Family History Family History  Problem Relation Age of Onset  .  Breast cancer Neg Hx     Social History Social History   Tobacco Use  . Smoking status: Current Every Day Smoker    Packs/day: 0.50    Years: 10.00    Pack years: 5.00    Types: Cigarettes  . Smokeless tobacco: Never Used  Vaping Use  . Vaping Use: Never used  Substance Use Topics  . Alcohol use: Yes    Alcohol/week: 1.0 standard drink    Types: 1 Shots of liquor per week  . Drug use: No     Allergies   Patient has no known allergies.   Review of Systems Review of Systems  Constitutional: Negative for appetite change, chills, fatigue and fever.  HENT: Negative for congestion, sinus pressure, sore throat, trouble swallowing and voice change.   Eyes: Negative for photophobia, pain, discharge, redness, itching and visual disturbance.  Respiratory: Negative for cough, chest  tightness and shortness of breath.   Cardiovascular: Negative for chest pain, palpitations and leg swelling.  Gastrointestinal: Negative for abdominal pain, constipation, diarrhea, nausea and vomiting.  Genitourinary: Negative for dysuria, flank pain, frequency and urgency.  Musculoskeletal: Negative for back pain, gait problem, myalgias, neck pain and neck stiffness.  Neurological: Positive for headaches. Negative for dizziness, tremors, seizures, syncope, facial asymmetry, speech difficulty, weakness, light-headedness and numbness.  Psychiatric/Behavioral: Negative for agitation, decreased concentration, dysphoric mood, hallucinations and suicidal ideas. The patient is not nervous/anxious.   All other systems reviewed and are negative.    Physical Exam Triage Vital Signs ED Triage Vitals  Enc Vitals Group     BP 02/17/21 1914 120/90     Pulse Rate 02/17/21 1914 78     Resp 02/17/21 1914 18     Temp 02/17/21 1914 98 F (36.7 C)     Temp Source 02/17/21 1914 Oral     SpO2 02/17/21 1914 100 %     Weight --      Height --      Head Circumference --      Peak Flow --      Pain Score 02/17/21 1911 6     Pain Loc --      Pain Edu? --      Excl. in GC? --    No data found.  Updated Vital Signs BP 120/90 (BP Location: Right Arm)   Pulse 78   Temp 98 F (36.7 C) (Oral)   Resp 18   LMP 01/24/2021 (Exact Date)   SpO2 100%   Visual Acuity Right Eye Distance:   Left Eye Distance:   Bilateral Distance:    Right Eye Near:   Left Eye Near:    Bilateral Near:     Physical Exam Vitals reviewed.  Constitutional:      General: She is not in acute distress.    Appearance: Normal appearance. She is not ill-appearing.  HENT:     Head: Normocephalic and atraumatic.  Cardiovascular:     Rate and Rhythm: Normal rate and regular rhythm.     Heart sounds: Normal heart sounds.  Pulmonary:     Effort: Pulmonary effort is normal.     Breath sounds: Normal breath sounds. No wheezing,  rhonchi or rales.  Abdominal:     General: Abdomen is flat. Bowel sounds are normal.     Palpations: Abdomen is soft.     Tenderness: There is no abdominal tenderness. There is no right CVA tenderness, left CVA tenderness, guarding or rebound. Negative signs include Murphy's sign, Rovsing's sign  and McBurney's sign.  Musculoskeletal:     Cervical back: Normal range of motion and neck supple. No rigidity.  Lymphadenopathy:     Cervical: No cervical adenopathy.  Neurological:     General: No focal deficit present.     Mental Status: She is alert and oriented to person, place, and time. Mental status is at baseline.     Cranial Nerves: Cranial nerves are intact. No cranial nerve deficit or facial asymmetry.     Sensory: Sensation is intact. No sensory deficit.     Motor: Motor function is intact. No weakness.     Coordination: Coordination is intact. Coordination normal.     Gait: Gait is intact. Gait normal.     Comments: CN 2-12 intact. No weakness or numbness in UEs or LEs.  Psychiatric:        Mood and Affect: Mood normal.        Behavior: Behavior normal.        Thought Content: Thought content normal.        Judgment: Judgment normal.      UC Treatments / Results  Labs (all labs ordered are listed, but only abnormal results are displayed) Labs Reviewed  HCG, QUANTITATIVE, PREGNANCY  POC URINE PREG, ED    EKG   Radiology No results found.  Procedures Procedures (including critical care time)  Medications Ordered in UC Medications - No data to display  Initial Impression / Assessment and Plan / UC Course  I have reviewed the triage vital signs and the nursing notes.  Pertinent labs & imaging results that were available during my care of the patient were reviewed by me and considered in my medical decision making (see chart for details).      This patient is a 30 year old female presenting with possible pregnancy and headache.  Multiple positive pregnancy  tests at home. Urine pregnancy negative today. Quantitative Hcg drawn; negative.   Tylenol for headaches until negative result. Discussed she must avoid NSAIDs until negative pregnancy test confirmation.  Strict return precautions discussed.   This chart was dictated using voice recognition software, Dragon. Despite the best efforts of this provider to proofread and correct errors, errors may still occur which can change documentation meaning.   Final Clinical Impressions(s) / UC Diagnoses   Final diagnoses:  Tension headache  Encounter for pregnancy test, result unknown  Negative pregnancy test     Discharge Instructions     -We are doing a blood test for pregnancy.  Should come back tomorrow. -If you develop the worst headache of her life, vision changes, sensitivity to light, pain or weakness in 1 arm or 1 leg, dizziness, fainting-head straight to the ER. -If you develop abdominal pain, especially with vaginal bleeding, vaginal discharge, urinary symptoms-head straight to the ER.    ED Prescriptions    None     PDMP not reviewed this encounter.   Rhys Martini, PA-C 02/17/21 1951    Rhys Martini, PA-C 02/18/21 [phone]

## 2021-02-17 NOTE — ED Triage Notes (Signed)
Pt presents with a headache 1 day. She states she took Tylenol and had no relief. She states she felt faint and states she thought she was pregnant. Pt states she has taken 3 pregnancy test at home that have had faint positives. Pt denies nausea and vomiting.
